# Patient Record
Sex: Male | Born: 1950 | Race: White | Hispanic: No | Marital: Married | Smoking: Former smoker
Health system: Southern US, Community
[De-identification: ages and names within clinical notes are randomized; demographics above are authoritative.]

## PROBLEM LIST (undated history)

## (undated) DIAGNOSIS — I219 Acute myocardial infarction, unspecified: Secondary | ICD-10-CM

## (undated) DIAGNOSIS — E785 Hyperlipidemia, unspecified: Secondary | ICD-10-CM

## (undated) DIAGNOSIS — E119 Type 2 diabetes mellitus without complications: Secondary | ICD-10-CM

## (undated) DIAGNOSIS — I739 Peripheral vascular disease, unspecified: Secondary | ICD-10-CM

## (undated) DIAGNOSIS — I255 Ischemic cardiomyopathy: Secondary | ICD-10-CM

## (undated) DIAGNOSIS — I10 Essential (primary) hypertension: Secondary | ICD-10-CM

## (undated) DIAGNOSIS — I509 Heart failure, unspecified: Secondary | ICD-10-CM

## (undated) DIAGNOSIS — I2510 Atherosclerotic heart disease of native coronary artery without angina pectoris: Secondary | ICD-10-CM

## (undated) HISTORY — PX: OTHER SURGICAL HISTORY: SHX169

---

## 2016-02-05 DIAGNOSIS — E119 Type 2 diabetes mellitus without complications: Secondary | ICD-10-CM

## 2017-02-17 DIAGNOSIS — I2510 Atherosclerotic heart disease of native coronary artery without angina pectoris: Secondary | ICD-10-CM | POA: Diagnosis present

## 2020-08-03 DIAGNOSIS — I719 Aortic aneurysm of unspecified site, without rupture: Secondary | ICD-10-CM | POA: Insufficient documentation

## 2021-12-22 ENCOUNTER — Other Ambulatory Visit: Payer: Self-pay

## 2021-12-22 ENCOUNTER — Encounter (HOSPITAL_COMMUNITY): Payer: Self-pay

## 2021-12-22 ENCOUNTER — Inpatient Hospital Stay (HOSPITAL_COMMUNITY)
Admission: EM | Admit: 2021-12-22 | Discharge: 2022-01-01 | DRG: 374 | Disposition: A | Discharge status: Home Health | Payer: Medicare Other | Source: Ambulatory Visit | Attending: Student | Admitting: Student

## 2021-12-22 DIAGNOSIS — E440 Moderate protein-calorie malnutrition: Secondary | ICD-10-CM | POA: Diagnosis present

## 2021-12-22 DIAGNOSIS — I248 Other forms of acute ischemic heart disease: Secondary | ICD-10-CM | POA: Diagnosis present

## 2021-12-22 DIAGNOSIS — J9601 Acute respiratory failure with hypoxia: Secondary | ICD-10-CM | POA: Diagnosis not present

## 2021-12-22 DIAGNOSIS — R0902 Hypoxemia: Secondary | ICD-10-CM

## 2021-12-22 DIAGNOSIS — E1151 Type 2 diabetes mellitus with diabetic peripheral angiopathy without gangrene: Secondary | ICD-10-CM | POA: Diagnosis present

## 2021-12-22 DIAGNOSIS — D649 Anemia, unspecified: Secondary | ICD-10-CM | POA: Diagnosis present

## 2021-12-22 DIAGNOSIS — Z87891 Personal history of nicotine dependence: Secondary | ICD-10-CM

## 2021-12-22 DIAGNOSIS — K621 Rectal polyp: Secondary | ICD-10-CM | POA: Diagnosis present

## 2021-12-22 DIAGNOSIS — I5023 Acute on chronic systolic (congestive) heart failure: Secondary | ICD-10-CM | POA: Diagnosis present

## 2021-12-22 DIAGNOSIS — C159 Malignant neoplasm of esophagus, unspecified: Secondary | ICD-10-CM

## 2021-12-22 DIAGNOSIS — R0602 Shortness of breath: Secondary | ICD-10-CM

## 2021-12-22 DIAGNOSIS — K5730 Diverticulosis of large intestine without perforation or abscess without bleeding: Secondary | ICD-10-CM | POA: Diagnosis present

## 2021-12-22 DIAGNOSIS — I2510 Atherosclerotic heart disease of native coronary artery without angina pectoris: Secondary | ICD-10-CM | POA: Diagnosis present

## 2021-12-22 DIAGNOSIS — M25561 Pain in right knee: Secondary | ICD-10-CM | POA: Diagnosis present

## 2021-12-22 DIAGNOSIS — L89312 Pressure ulcer of right buttock, stage 2: Secondary | ICD-10-CM | POA: Diagnosis present

## 2021-12-22 DIAGNOSIS — R0682 Tachypnea, not elsewhere classified: Secondary | ICD-10-CM

## 2021-12-22 DIAGNOSIS — J90 Pleural effusion, not elsewhere classified: Secondary | ICD-10-CM | POA: Diagnosis present

## 2021-12-22 DIAGNOSIS — K635 Polyp of colon: Secondary | ICD-10-CM | POA: Diagnosis present

## 2021-12-22 DIAGNOSIS — C160 Malignant neoplasm of cardia: Secondary | ICD-10-CM | POA: Diagnosis not present

## 2021-12-22 DIAGNOSIS — Z20822 Contact with and (suspected) exposure to covid-19: Secondary | ICD-10-CM | POA: Diagnosis present

## 2021-12-22 DIAGNOSIS — I130 Hypertensive heart and chronic kidney disease with heart failure and stage 1 through stage 4 chronic kidney disease, or unspecified chronic kidney disease: Secondary | ICD-10-CM | POA: Diagnosis present

## 2021-12-22 DIAGNOSIS — I5043 Acute on chronic combined systolic (congestive) and diastolic (congestive) heart failure: Secondary | ICD-10-CM | POA: Diagnosis present

## 2021-12-22 DIAGNOSIS — E782 Mixed hyperlipidemia: Secondary | ICD-10-CM | POA: Diagnosis present

## 2021-12-22 DIAGNOSIS — I509 Heart failure, unspecified: Secondary | ICD-10-CM

## 2021-12-22 DIAGNOSIS — I252 Old myocardial infarction: Secondary | ICD-10-CM

## 2021-12-22 DIAGNOSIS — Z955 Presence of coronary angioplasty implant and graft: Secondary | ICD-10-CM

## 2021-12-22 DIAGNOSIS — R778 Other specified abnormalities of plasma proteins: Secondary | ICD-10-CM | POA: Diagnosis not present

## 2021-12-22 DIAGNOSIS — I10 Essential (primary) hypertension: Secondary | ICD-10-CM | POA: Diagnosis present

## 2021-12-22 DIAGNOSIS — Z79899 Other long term (current) drug therapy: Secondary | ICD-10-CM

## 2021-12-22 DIAGNOSIS — E119 Type 2 diabetes mellitus without complications: Secondary | ICD-10-CM

## 2021-12-22 DIAGNOSIS — E876 Hypokalemia: Secondary | ICD-10-CM | POA: Diagnosis present

## 2021-12-22 DIAGNOSIS — Z800 Family history of malignant neoplasm of digestive organs: Secondary | ICD-10-CM

## 2021-12-22 DIAGNOSIS — K2970 Gastritis, unspecified, without bleeding: Secondary | ICD-10-CM

## 2021-12-22 DIAGNOSIS — R54 Age-related physical debility: Secondary | ICD-10-CM | POA: Diagnosis present

## 2021-12-22 DIAGNOSIS — K648 Other hemorrhoids: Secondary | ICD-10-CM | POA: Diagnosis present

## 2021-12-22 DIAGNOSIS — D62 Acute posthemorrhagic anemia: Secondary | ICD-10-CM | POA: Diagnosis present

## 2021-12-22 DIAGNOSIS — E1122 Type 2 diabetes mellitus with diabetic chronic kidney disease: Secondary | ICD-10-CM | POA: Diagnosis present

## 2021-12-22 DIAGNOSIS — K922 Gastrointestinal hemorrhage, unspecified: Secondary | ICD-10-CM | POA: Diagnosis not present

## 2021-12-22 DIAGNOSIS — L8990 Pressure ulcer of unspecified site, unspecified stage: Secondary | ICD-10-CM | POA: Insufficient documentation

## 2021-12-22 DIAGNOSIS — Z8249 Family history of ischemic heart disease and other diseases of the circulatory system: Secondary | ICD-10-CM

## 2021-12-22 DIAGNOSIS — I719 Aortic aneurysm of unspecified site, without rupture: Secondary | ICD-10-CM

## 2021-12-22 DIAGNOSIS — Z6821 Body mass index (BMI) 21.0-21.9, adult: Secondary | ICD-10-CM

## 2021-12-22 DIAGNOSIS — M1711 Unilateral primary osteoarthritis, right knee: Secondary | ICD-10-CM | POA: Diagnosis present

## 2021-12-22 DIAGNOSIS — I255 Ischemic cardiomyopathy: Secondary | ICD-10-CM | POA: Diagnosis present

## 2021-12-22 DIAGNOSIS — K644 Residual hemorrhoidal skin tags: Secondary | ICD-10-CM | POA: Diagnosis present

## 2021-12-22 DIAGNOSIS — N1831 Chronic kidney disease, stage 3a: Secondary | ICD-10-CM | POA: Diagnosis present

## 2021-12-22 DIAGNOSIS — K5520 Angiodysplasia of colon without hemorrhage: Secondary | ICD-10-CM | POA: Diagnosis present

## 2021-12-22 DIAGNOSIS — I739 Peripheral vascular disease, unspecified: Secondary | ICD-10-CM | POA: Diagnosis present

## 2021-12-22 DIAGNOSIS — I7143 Infrarenal abdominal aortic aneurysm, without rupture: Secondary | ICD-10-CM | POA: Diagnosis present

## 2021-12-22 HISTORY — DX: Atherosclerotic heart disease of native coronary artery without angina pectoris: I25.10

## 2021-12-22 HISTORY — DX: Heart failure, unspecified: I50.9

## 2021-12-22 HISTORY — DX: Type 2 diabetes mellitus without complications: E11.9

## 2021-12-22 HISTORY — DX: Acute myocardial infarction, unspecified: I21.9

## 2021-12-22 HISTORY — DX: Essential (primary) hypertension: I10

## 2021-12-22 HISTORY — DX: Peripheral vascular disease, unspecified: I73.9

## 2021-12-22 HISTORY — DX: Ischemic cardiomyopathy: I25.5

## 2021-12-22 HISTORY — DX: Hyperlipidemia, unspecified: E78.5

## 2021-12-22 LAB — BASIC METABOLIC PANEL
Anion gap: 14 (ref 5–15)
BUN: 29 mg/dL — ABNORMAL HIGH (ref 8–23)
CO2: 23 mmol/L (ref 22–32)
Calcium: 8.9 mg/dL (ref 8.9–10.3)
Chloride: 106 mmol/L (ref 98–111)
Creatinine, Ser: 1.4 mg/dL — ABNORMAL HIGH (ref 0.61–1.24)
GFR, Estimated: 54 mL/min — ABNORMAL LOW (ref >60)
Glucose, Bld: 160 mg/dL — ABNORMAL HIGH (ref 70–99)
Potassium: 3.3 mmol/L — ABNORMAL LOW (ref 3.5–5.1)
Sodium: 143 mmol/L (ref 135–145)

## 2021-12-22 LAB — CBC
HCT: 23 % — ABNORMAL LOW (ref 39.0–52.0)
Hemoglobin: 7.1 g/dL — ABNORMAL LOW (ref 13.0–17.0)
MCH: 28.6 pg (ref 26.0–34.0)
MCHC: 30.9 g/dL (ref 30.0–36.0)
MCV: 92.7 fL (ref 80.0–100.0)
Platelets: 275 10*3/uL (ref 150–400)
RBC: 2.48 MIL/uL — ABNORMAL LOW (ref 4.22–5.81)
RDW: 15.9 % — ABNORMAL HIGH (ref 11.5–15.5)
WBC: 11.4 10*3/uL — ABNORMAL HIGH (ref 4.0–10.5)
nRBC: 0 % (ref 0.0–0.2)

## 2021-12-22 LAB — ABO/RH: ABO/RH(D): O POS

## 2021-12-22 LAB — RESP PANEL BY RT-PCR (FLU A&B, COVID) ARPGX2
Influenza A by PCR: NEGATIVE
Influenza B by PCR: NEGATIVE
SARS Coronavirus 2 by RT PCR: NEGATIVE

## 2021-12-22 LAB — RETICULOCYTES
Immature Retic Fract: 28.8 % — ABNORMAL HIGH (ref 2.3–15.9)
RBC.: 2.51 MIL/uL — ABNORMAL LOW (ref 4.22–5.81)
Retic Count, Absolute: 95.6 10*3/uL (ref 19.0–186.0)
Retic Ct Pct: 3.8 % — ABNORMAL HIGH (ref 0.4–3.1)

## 2021-12-22 LAB — PREPARE RBC (CROSSMATCH)

## 2021-12-22 LAB — POC OCCULT BLOOD, ED: Fecal Occult Bld: POSITIVE — AB

## 2021-12-22 MED ORDER — SODIUM CHLORIDE 0.9% FLUSH
3.0000 mL | Freq: Two times a day (BID) | INTRAVENOUS | Status: DC
  Administered 2021-12-23 – 2022-01-01 (×17): 3 mL via INTRAVENOUS

## 2021-12-22 MED ORDER — SODIUM CHLORIDE 0.9 % IV SOLN: 10.0000 mL/h | Freq: Once | INTRAVENOUS | Status: DC

## 2021-12-22 MED ORDER — POTASSIUM CHLORIDE CRYS ER 20 MEQ PO TBCR
40.0000 meq | EXTENDED_RELEASE_TABLET | Freq: Once | ORAL | Status: AC
  Administered 2021-12-23: 40 meq via ORAL
  Filled 2021-12-22: qty 2

## 2021-12-22 MED ORDER — EZETIMIBE 10 MG PO TABS
10.0000 mg | ORAL_TABLET | Freq: Every day | ORAL | Status: DC
  Administered 2021-12-23 – 2022-01-01 (×10): 10 mg via ORAL
  Filled 2021-12-22 (×10): qty 1

## 2021-12-22 MED ORDER — INSULIN ASPART 100 UNIT/ML IJ SOLN
0.0000 [IU] | Freq: Three times a day (TID) | INTRAMUSCULAR | Status: DC
  Administered 2021-12-23: 3 [IU] via SUBCUTANEOUS
  Administered 2021-12-24 – 2021-12-25 (×5): 1 [IU] via SUBCUTANEOUS
  Administered 2021-12-26 – 2021-12-30 (×3): 2 [IU] via SUBCUTANEOUS
  Administered 2021-12-31: 1 [IU] via SUBCUTANEOUS
  Administered 2021-12-31 – 2022-01-01 (×3): 2 [IU] via SUBCUTANEOUS
  Filled 2021-12-22: qty 0.09

## 2021-12-22 MED ORDER — PANTOPRAZOLE INFUSION (NEW) - SIMPLE MED
8.0000 mg/h | INTRAVENOUS | Status: AC
  Administered 2021-12-22 – 2021-12-25 (×7): 8 mg/h via INTRAVENOUS
  Filled 2021-12-22: qty 100
  Filled 2021-12-22 (×2): qty 80
  Filled 2021-12-22: qty 100
  Filled 2021-12-22: qty 80
  Filled 2021-12-22 (×3): qty 100
  Filled 2021-12-22: qty 80
  Filled 2021-12-22: qty 100
  Filled 2021-12-22: qty 80
  Filled 2021-12-22: qty 100

## 2021-12-22 MED ORDER — ACETAMINOPHEN 325 MG PO TABS: 650.0000 mg | ORAL_TABLET | Freq: Four times a day (QID) | ORAL | Status: DC | PRN

## 2021-12-22 MED ORDER — ATORVASTATIN CALCIUM 80 MG PO TABS
80.0000 mg | ORAL_TABLET | Freq: Every day | ORAL | Status: DC
  Administered 2021-12-23 – 2022-01-01 (×10): 80 mg via ORAL
  Filled 2021-12-22 (×4): qty 1
  Filled 2021-12-22: qty 2
  Filled 2021-12-22 (×5): qty 1

## 2021-12-22 MED ORDER — PANTOPRAZOLE 80MG IVPB - SIMPLE MED
80.0000 mg | Freq: Once | INTRAVENOUS | Status: AC
  Administered 2021-12-22: 80 mg via INTRAVENOUS
  Filled 2021-12-22: qty 80

## 2021-12-22 MED ORDER — ACETAMINOPHEN 650 MG RE SUPP: 650.0000 mg | Freq: Four times a day (QID) | RECTAL | Status: DC | PRN

## 2021-12-22 NOTE — H&P (Signed)
History and Physical   Bob Johnson XTK:[id number] DOB: 03/13/1951 DOA: 12/22/2021  PCP: Bob Connors, FNP   Patient coming from: Home  Chief Complaint: Abnormal labs  HPI: Bob Johnson is a 71 y.o. male with medical history significant of hypertension, hyperlipidemia, PAD, CAD status post stenting, diabetes, aortic ulcer, presenting from PCP with abnormal labs.  Patient saw PCP earlier today and was notified of abnormal lab values and recommendation to proceed to the ED.  Chart review shows outpatient hemoglobin was 7.2 which is unusual for him.  Patient denies any dark or grossly bloody stools.    He reports some shortness of breath for the past couple of weeks.  He denies fevers, chills, chest pain, abdominal pain, constipation, diarrhea, nausea, vomiting.  ED Course: Vital signs in the ED significant for blood pressure in the 99M to 426 systolic.  Has had prior systolics in the 834H at baseline to the 130s.  Lab work-up in the ED showed mild leukocytosis to 11.4 and hemoglobin of 7.1.  Review shows previous hemoglobin earlier today was at 7.2.  Patient was FOBT positive in ED.  BMP still pending however outpatient labs showed creatinine stable at 1.4 and mild hypokalemia at 3.3.  Patient received IV PPI and started on PPI drip.  EDP states that they are paging GI for consult.  Review of Systems: As per HPI otherwise all other systems reviewed and are negative.  Past Medical History:  Diagnosis Date   Hyperlipemia    Hypertension    MI (myocardial infarction) ([place])     History reviewed. No pertinent surgical history.  Social History  reports that he has never smoked. He has never used smokeless tobacco. He reports that he does not currently use alcohol. He reports that he does not use drugs.  Not on File  Family History  Problem Relation Age of Onset   Cancer Mother    Alcohol abuse Father   Reviewed on admission  Prior to Admission medications   Not on File  Per  chart review: Spironolactone 25 mg daily Aspirin 81 mg daily Atorvastatin 80 mg daily Entresto 24-26 mg twice daily Zetia 10 mg daily Lasix 80 mg daily Metformin daily Metoprolol 75 mg daily Nitroglycerin as needed Potassium daily  Physical Exam: Vitals:   12/22/21 1924 12/22/21 1930 12/22/21 2030 12/22/21 2045  BP: (!) 106/56 (!) 106/56 (!) 95/49 (!) 96/51  Pulse: 91 90 83 86  Resp: 19  19 16   Temp: 98.1 F (36.7 C)     TempSrc: Axillary     SpO2: 96% 94% 94% 99%  Weight: 76.2 kg     Height: 6\' 1"  (1.854 m)       Physical Exam Constitutional:      General: He is not in acute distress.    Appearance: Normal appearance.  HENT:     Head: Normocephalic and atraumatic.     Mouth/Throat:     Mouth: Mucous membranes are moist.     Pharynx: Oropharynx is clear.  Eyes:     Extraocular Movements: Extraocular movements intact.     Pupils: Pupils are equal, round, and reactive to light.  Cardiovascular:     Rate and Rhythm: Normal rate and regular rhythm.     Pulses: Normal pulses.     Heart sounds: Normal heart sounds.  Pulmonary:     Effort: Pulmonary effort is normal. No respiratory distress.     Breath sounds: Normal breath sounds.  Abdominal:     General:  Bowel sounds are normal. There is no distension.     Palpations: Abdomen is soft.     Tenderness: There is no abdominal tenderness.  Musculoskeletal:        General: No swelling or deformity.  Skin:    General: Skin is warm and dry.  Neurological:     General: No focal deficit present.     Mental Status: Mental status is at baseline.   Labs on Admission: I have personally reviewed following labs and imaging studies  CBC: Recent Labs  Lab 12/22/21 1941  WBC 11.4*  HGB 7.1*  HCT 23.0*  MCV 92.7  PLT 423    Basic Metabolic Panel: No results for input(s): NA, K, CL, CO2, GLUCOSE, BUN, CREATININE, CALCIUM, MG, PHOS in the last 168 hours.  GFR: CrCl cannot be calculated (No successful lab value  found.).  Liver Function Tests: No results for input(s): AST, ALT, ALKPHOS, BILITOT, PROT, ALBUMIN in the last 168 hours.  Urine analysis: No results found for: COLORURINE, APPEARANCEUR, LABSPEC, PHURINE, GLUCOSEU, HGBUR, BILIRUBINUR, KETONESUR, PROTEINUR, UROBILINOGEN, NITRITE, LEUKOCYTESUR  Radiological Exams on Admission: No results found.  EKG: Not performed in the ED.  Assessment/Plan Principal Problem:   GI bleed Active Problems:   CAD (coronary artery disease)   Essential hypertension   Mixed hyperlipidemia   PAD (peripheral artery disease) (HCC)   Type 2 diabetes mellitus without complication, without long-term current use of insulin (HCC)   GI bleeding > Patient sent to the ED due to abnormal labs at PCP office.  Their hemoglobin found to be 7.2.  Here hemoglobin remains stable at 7.1. > No grossly bloody or dark stools.  Appeared brown on rectal exam per ED PA but was heme positive. > Started on PPI in ED and 2 units PRBC ordered by EDP > EDP has also consulted GI and is waiting to hear back. - Due to borderline blood pressures (which could be his baseline) will monitor on progressive for now - Continue with PPI drip - Appreciate GI recommendations - Sips with meds overnight - Continue with blood transfusion - Trend hemoglobin  CAD (with history of MI and stenting) PAD Hypertension CHF (last echo in 2020 with mildly reduced LVEF and normal RVEF) - Holding home aspirin in the setting of GI bleed as above - Holding home Entresto, spironolactone, Lasix and metoprolol in the setting of borderline blood pressure with active GI bleed as above - Continue atorvastatin and Zetia  Diabetes - SSI  Hypokalemia > Labs here are pending however outpatient labs showed stable hypokalemia at 3.3 and normal magnesium. - 40 mill equivalents p.o. potassium - Trend renal function and electrolytes  DVT prophylaxis: SCDs Code Status:   Full Family Communication:  Wife and  daughter updated at bedside Disposition Plan:   Patient is from:  Home  Anticipated DC to:  Home  Anticipated DC date:  1 to 3 days  Anticipated DC barriers: None  Consults called:  GI, consulted by EDP, still waiting to hear back at the time that we spoke. Admission status:  Alteration, progressive  Severity of Illness: The appropriate patient status for this patient is OBSERVATION. Observation status is judged to be reasonable and necessary in order to provide the required intensity of service to ensure the patient's safety. The patient's presenting symptoms, physical exam findings, and initial radiographic and laboratory data in the context of their medical condition is felt to place them at decreased risk for further clinical deterioration. Furthermore, it is anticipated that  the patient will be medically stable for discharge from the hospital within 2 midnights of admission.    Marcelyn Bruins MD [facility]  How to contact the [facility] Attending or Consulting provider [place] or covering provider during after hours [place], for this patient?   Check the care team in [facility] and look for a) attending/consulting [facility] provider listed and b) the [facility] team listed Log into [email or web address] and use [place]'s universal password to access. If you do not have the password, please contact the hospital operator. Locate the [facility] provider you are looking for under [facility] and page to a number that you can be directly reached. If you still have difficulty reaching the provider, please page the [facility] (Director on Call) for the Hospitalists listed on amion for assistance.  12/22/2021, 9:56 PM

## 2021-12-22 NOTE — ED Triage Notes (Signed)
Patient sent to ED by PCP for low HGB. Pt states he has been feeling SOB, pt noted to be pale upon triage.

## 2021-12-23 ENCOUNTER — Encounter (HOSPITAL_COMMUNITY): Payer: Self-pay | Admitting: Internal Medicine

## 2021-12-23 ENCOUNTER — Observation Stay (HOSPITAL_COMMUNITY): Payer: Medicare Other

## 2021-12-23 DIAGNOSIS — J9601 Acute respiratory failure with hypoxia: Secondary | ICD-10-CM | POA: Diagnosis not present

## 2021-12-23 DIAGNOSIS — E440 Moderate protein-calorie malnutrition: Secondary | ICD-10-CM | POA: Diagnosis not present

## 2021-12-23 DIAGNOSIS — I714 Abdominal aortic aneurysm, without rupture, unspecified: Secondary | ICD-10-CM | POA: Diagnosis not present

## 2021-12-23 DIAGNOSIS — D649 Anemia, unspecified: Secondary | ICD-10-CM | POA: Diagnosis present

## 2021-12-23 DIAGNOSIS — I719 Aortic aneurysm of unspecified site, without rupture: Secondary | ICD-10-CM

## 2021-12-23 DIAGNOSIS — N1831 Chronic kidney disease, stage 3a: Secondary | ICD-10-CM | POA: Diagnosis not present

## 2021-12-23 DIAGNOSIS — I7143 Infrarenal abdominal aortic aneurysm, without rupture: Secondary | ICD-10-CM | POA: Diagnosis present

## 2021-12-23 DIAGNOSIS — I2510 Atherosclerotic heart disease of native coronary artery without angina pectoris: Secondary | ICD-10-CM | POA: Diagnosis not present

## 2021-12-23 DIAGNOSIS — K5520 Angiodysplasia of colon without hemorrhage: Secondary | ICD-10-CM | POA: Diagnosis not present

## 2021-12-23 DIAGNOSIS — I7409 Other arterial embolism and thrombosis of abdominal aorta: Secondary | ICD-10-CM | POA: Diagnosis not present

## 2021-12-23 DIAGNOSIS — E876 Hypokalemia: Secondary | ICD-10-CM | POA: Diagnosis not present

## 2021-12-23 DIAGNOSIS — K621 Rectal polyp: Secondary | ICD-10-CM | POA: Diagnosis not present

## 2021-12-23 DIAGNOSIS — C160 Malignant neoplasm of cardia: Secondary | ICD-10-CM | POA: Diagnosis not present

## 2021-12-23 DIAGNOSIS — E1151 Type 2 diabetes mellitus with diabetic peripheral angiopathy without gangrene: Secondary | ICD-10-CM | POA: Diagnosis not present

## 2021-12-23 DIAGNOSIS — M1711 Unilateral primary osteoarthritis, right knee: Secondary | ICD-10-CM | POA: Diagnosis not present

## 2021-12-23 DIAGNOSIS — K644 Residual hemorrhoidal skin tags: Secondary | ICD-10-CM | POA: Diagnosis not present

## 2021-12-23 DIAGNOSIS — I255 Ischemic cardiomyopathy: Secondary | ICD-10-CM | POA: Diagnosis not present

## 2021-12-23 DIAGNOSIS — I130 Hypertensive heart and chronic kidney disease with heart failure and stage 1 through stage 4 chronic kidney disease, or unspecified chronic kidney disease: Secondary | ICD-10-CM | POA: Diagnosis not present

## 2021-12-23 DIAGNOSIS — I5043 Acute on chronic combined systolic (congestive) and diastolic (congestive) heart failure: Secondary | ICD-10-CM | POA: Diagnosis not present

## 2021-12-23 DIAGNOSIS — Z20822 Contact with and (suspected) exposure to covid-19: Secondary | ICD-10-CM | POA: Diagnosis not present

## 2021-12-23 DIAGNOSIS — L89312 Pressure ulcer of right buttock, stage 2: Secondary | ICD-10-CM | POA: Diagnosis not present

## 2021-12-23 DIAGNOSIS — K648 Other hemorrhoids: Secondary | ICD-10-CM | POA: Diagnosis not present

## 2021-12-23 DIAGNOSIS — I252 Old myocardial infarction: Secondary | ICD-10-CM | POA: Diagnosis not present

## 2021-12-23 DIAGNOSIS — E782 Mixed hyperlipidemia: Secondary | ICD-10-CM | POA: Diagnosis not present

## 2021-12-23 DIAGNOSIS — E1122 Type 2 diabetes mellitus with diabetic chronic kidney disease: Secondary | ICD-10-CM | POA: Diagnosis not present

## 2021-12-23 DIAGNOSIS — K922 Gastrointestinal hemorrhage, unspecified: Secondary | ICD-10-CM | POA: Diagnosis not present

## 2021-12-23 DIAGNOSIS — D62 Acute posthemorrhagic anemia: Secondary | ICD-10-CM | POA: Diagnosis not present

## 2021-12-23 DIAGNOSIS — K635 Polyp of colon: Secondary | ICD-10-CM | POA: Diagnosis not present

## 2021-12-23 DIAGNOSIS — I5023 Acute on chronic systolic (congestive) heart failure: Secondary | ICD-10-CM | POA: Diagnosis present

## 2021-12-23 DIAGNOSIS — I248 Other forms of acute ischemic heart disease: Secondary | ICD-10-CM | POA: Diagnosis not present

## 2021-12-23 LAB — COMPREHENSIVE METABOLIC PANEL
ALT: 9 U/L (ref 0–44)
AST: 15 U/L (ref 15–41)
Albumin: 3 g/dL — ABNORMAL LOW (ref 3.5–5.0)
Alkaline Phosphatase: 58 U/L (ref 38–126)
Anion gap: 12 (ref 5–15)
BUN: 26 mg/dL — ABNORMAL HIGH (ref 8–23)
CO2: 23 mmol/L (ref 22–32)
Calcium: 8.6 mg/dL — ABNORMAL LOW (ref 8.9–10.3)
Chloride: 107 mmol/L (ref 98–111)
Creatinine, Ser: 1.24 mg/dL (ref 0.61–1.24)
GFR, Estimated: >60 mL/min (ref >60)
Glucose, Bld: 107 mg/dL — ABNORMAL HIGH (ref 70–99)
Potassium: 3.4 mmol/L — ABNORMAL LOW (ref 3.5–5.1)
Sodium: 142 mmol/L (ref 135–145)
Total Bilirubin: 0.4 mg/dL (ref 0.3–1.2)
Total Protein: 6.7 g/dL (ref 6.5–8.1)

## 2021-12-23 LAB — CBC
HCT: 26.5 % — ABNORMAL LOW (ref 39.0–52.0)
Hemoglobin: 8.4 g/dL — ABNORMAL LOW (ref 13.0–17.0)
MCH: 28.5 pg (ref 26.0–34.0)
MCHC: 31.7 g/dL (ref 30.0–36.0)
MCV: 89.8 fL (ref 80.0–100.0)
Platelets: 206 10*3/uL (ref 150–400)
RBC: 2.95 MIL/uL — ABNORMAL LOW (ref 4.22–5.81)
RDW: 16.4 % — ABNORMAL HIGH (ref 11.5–15.5)
WBC: 10.1 10*3/uL (ref 4.0–10.5)
nRBC: 0 % (ref 0.0–0.2)

## 2021-12-23 LAB — IRON AND TIBC
Iron: 35 ug/dL — ABNORMAL LOW (ref 45–182)
Saturation Ratios: 10 % — ABNORMAL LOW (ref 17.9–39.5)
TIBC: 357 ug/dL (ref 250–450)
UIBC: 322 ug/dL

## 2021-12-23 LAB — TYPE AND SCREEN
ABO/RH(D): O POS
Antibody Screen: NEGATIVE

## 2021-12-23 LAB — SEDIMENTATION RATE: Sed Rate: 65 mm/hr — ABNORMAL HIGH (ref 0–16)

## 2021-12-23 LAB — CBG MONITORING, ED
Glucose-Capillary: 101 mg/dL — ABNORMAL HIGH (ref 70–99)
Glucose-Capillary: 114 mg/dL — ABNORMAL HIGH (ref 70–99)
Glucose-Capillary: 119 mg/dL — ABNORMAL HIGH (ref 70–99)

## 2021-12-23 LAB — FERRITIN: Ferritin: 37 ng/mL (ref 24–336)

## 2021-12-23 LAB — GLUCOSE, CAPILLARY: Glucose-Capillary: 217 mg/dL — ABNORMAL HIGH (ref 70–99)

## 2021-12-23 LAB — C-REACTIVE PROTEIN: CRP: 5.7 mg/dL — ABNORMAL HIGH (ref <1.0)

## 2021-12-23 LAB — HIV ANTIBODY (ROUTINE TESTING W REFLEX): HIV Screen 4th Generation wRfx: NONREACTIVE

## 2021-12-23 MED ORDER — FUROSEMIDE 10 MG/ML IJ SOLN: 40.0000 mg | Freq: Two times a day (BID) | INTRAMUSCULAR | Status: DC

## 2021-12-23 MED ORDER — FUROSEMIDE 10 MG/ML IJ SOLN
20.0000 mg | Freq: Two times a day (BID) | INTRAMUSCULAR | Status: DC
  Administered 2021-12-23 – 2021-12-24 (×2): 20 mg via INTRAVENOUS
  Filled 2021-12-23 (×3): qty 2

## 2021-12-23 MED ORDER — SODIUM CHLORIDE (PF) 0.9 % IJ SOLN
INTRAMUSCULAR | Status: AC
  Filled 2021-12-23: qty 50

## 2021-12-23 MED ORDER — IPRATROPIUM-ALBUTEROL 0.5-2.5 (3) MG/3ML IN SOLN: 3.0000 mL | Freq: Four times a day (QID) | RESPIRATORY_TRACT | Status: DC | PRN

## 2021-12-23 MED ORDER — IOHEXOL 350 MG/ML SOLN
100.0000 mL | Freq: Once | INTRAVENOUS | Status: AC | PRN
  Administered 2021-12-23: 100 mL via INTRAVENOUS

## 2021-12-23 MED ORDER — POTASSIUM CHLORIDE 10 MEQ/100ML IV SOLN
10.0000 meq | INTRAVENOUS | Status: AC
  Administered 2021-12-23: 10 meq via INTRAVENOUS
  Filled 2021-12-23 (×2): qty 100

## 2021-12-23 MED ORDER — PEG 3350-KCL-NA BICARB-NACL 420 G PO SOLR
4000.0000 mL | Freq: Once | ORAL | Status: DC
Start: 2021-12-23 — End: 2021-12-23
  Filled 2021-12-23: qty 4000

## 2021-12-23 MED ORDER — FUROSEMIDE 10 MG/ML IJ SOLN
40.0000 mg | Freq: Once | INTRAMUSCULAR | Status: AC
  Administered 2021-12-23: 40 mg via INTRAVENOUS

## 2021-12-23 MED ORDER — GUAIFENESIN ER 600 MG PO TB12
600.0000 mg | ORAL_TABLET | Freq: Two times a day (BID) | ORAL | Status: DC
  Administered 2021-12-23 – 2022-01-01 (×19): 600 mg via ORAL
  Filled 2021-12-23 (×19): qty 1

## 2021-12-23 MED ORDER — POTASSIUM CHLORIDE CRYS ER 20 MEQ PO TBCR
40.0000 meq | EXTENDED_RELEASE_TABLET | Freq: Once | ORAL | Status: AC
  Administered 2021-12-23: 40 meq via ORAL
  Filled 2021-12-23: qty 2

## 2021-12-23 MED ORDER — FUROSEMIDE 10 MG/ML IJ SOLN
20.0000 mg | Freq: Once | INTRAMUSCULAR | Status: AC
  Administered 2021-12-23: 20 mg via INTRAVENOUS
  Filled 2021-12-23: qty 4

## 2021-12-23 NOTE — Progress Notes (Signed)
Rapid response note  Patient admitted from [facility] for vascular evaluation given inferior aortic aneurysm.  Initially presenting per PCP request for low hemoglobin concerning for symptomatic anemia as patient had been having some dyspnea with exertion.  Patient received blood at previous facility, routine imaging shows "impending rupture of inferior aortic aneurysm" at which time vascular surgery was consulted and patient was transferred to [facility] for further evaluation and treatment.  At intake patient was noted to be hypoxic tachycardic and tachypneic with marked bilateral rales likely in fulminant volume overload/heart failure exacerbation status post blood transfusion -as such rapid response was called, Dr. Carlis Abbott of vascular surgery and myself responded.  Patient diuresing well, will place on bipap PRN over the next few hours as he continues to diurese.  Currently tolerating nonrebreather well, EKG unremarkable, chest x-ray pending.

## 2021-12-23 NOTE — Assessment & Plan Note (Addendum)
-  Continue with Lipitor and Zetia.

## 2021-12-23 NOTE — Progress Notes (Signed)
Initial Nutrition Assessment  DOCUMENTATION CODES:   Not applicable  INTERVENTION:   When diet is advanced, add:  Ensure Enlive po BID, each supplement provides 350 kcal and 20 grams of protein.  MVI with minerals daily  NUTRITION DIAGNOSIS:   Inadequate oral intake related to inability to eat, altered GI function as evidenced by NPO status.  GOAL:   Patient will meet greater than or equal to 90% of their needs  MONITOR:   Diet advancement, PO intake, Supplement acceptance, Labs  REASON FOR ASSESSMENT:   Malnutrition Screening Tool    ASSESSMENT:   71 yo male admitted with GI bleed. PMH includes HTN, HLD, PAD, CAD, DM, aortic ulcer.  GI has been consulted. Plans for colonoscopy after cleared by vascular surgery.   Unable to visit patient or complete NFPE, multiple healthcare providers in his room providing care.   On admission nutrition screen, patient reported 15-20 lb weight loss over the past 2 months, appetite has been fair. No weight history available in EMR for review. Currently NPO.  Labs reviewed. K 3.4 CBG: 114-119  Medications reviewed and include Lasix, Novolog.    NUTRITION - FOCUSED PHYSICAL EXAM:  Unable to complete  Diet Order:   Diet Order             Diet NPO time specified  Diet effective midnight           Diet NPO time specified  Diet effective midnight                   EDUCATION NEEDS:   No education needs have been identified at this time  Skin:  Skin Assessment: Reviewed RN Assessment  Last BM:  no BM documented  Height:   Ht Readings from Last 1 Encounters:  12/22/21 6\' 1"  (1.854 m)    Weight:   Wt Readings from Last 1 Encounters:  12/22/21 76.2 kg    BMI:  Body mass index is 22.16 kg/m.  Estimated Nutritional Needs:   Kcal:  2100-2300  Protein:  100-115 gm  Fluid:  >/= 2.2 L    Lucas Mallow RD, LDN, CNSC Please refer to Amion for contact information.

## 2021-12-23 NOTE — Assessment & Plan Note (Addendum)
CT report was read as impending AAA rupture.  CT reviewed by vascular surgery.  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  He is not concerned about aortitis.  No surgical intervention indicated. -Outpatient follow-up in 6 months

## 2021-12-23 NOTE — Progress Notes (Signed)
Pt taken off bipap and placed on a 3l [place] per Dr. Avon Gully. Pt is tolerating well. RT will continue to monitor.

## 2021-12-23 NOTE — Assessment & Plan Note (Addendum)
TTE with LVEF of 20 to 25% (previously 40%), global hypokinesis, G2 DD.  Could be exacerbated by anemia and blood transfusion.  Started on IV Lasix with improvement in his respiratory symptoms and fluid status.  Transition to p.o. Lasix.  Net -4.6 L.  Clear for discharge on home p.o. Lasix (confirmed with cardiology), Toprol-XL, Entresto, Farxiga and Aldactone.  Counseled on sodium and fluid restriction and daily weight.  Outpatient follow-up with cardiology.

## 2021-12-23 NOTE — Assessment & Plan Note (Addendum)
-  Cardiac meds as above.

## 2021-12-23 NOTE — Plan of Care (Signed)

## 2021-12-23 NOTE — Assessment & Plan Note (Signed)
Patient with history of penetrating ulcer aorta.  He was suppose to have year follow up.  Continue with statins and BP management.  WiIll check CTA due to SOB and anemia

## 2021-12-23 NOTE — Assessment & Plan Note (Deleted)
Patient presents with anemia hemoglobin down to 7.2.  Prior hemoglobin 10 months ago at 13. He was found to have occult blood positive. He denies melena or bloody stool. He  is receiving 2 units of packed red blood cell. GI consulted and recommended endoscopy colonoscopy.

## 2021-12-23 NOTE — Consult Note (Signed)
Hospital Consult    Reason for Consult:  Abdominal aortic aneurysm  Referring Physician:  Dr. Tyrell Antonio MRN #:  [id number]  History of Present Illness: This is a 71 y.o. male with history of congestive heart failure, diabetes, hypertension, coronary artery disease that vascular surgery has been consulted with for abdominal aortic aneurysm.  Patient was admitted with shortness of breath and new onset anemia and seen in the [facility] ED.  CTA was obtained in the [place] long ED given a reported history of penetrating aortic ulcer.  This was read by the radiologist as infrarenal aneurysm measuring up to 3.8 cm with mural thrombus with some hyperdense thickening and periaortic stranding.  He was subsequently transferred to [facility] for vascular evaluation.  On my evaluation here after transfer from [facility], patient denies any abdominal or back pain.  He states he went to the ED due to shortness of breath over the last 3 days.  His aneurysm has been previously identified based on a CT at [facility] on 08/03/2020 that showed ectasia of the infrarenal aorta measuring 3.2 cm with laminated plaque.  Also concern for penetrating aortic ulcer of the aortic arch measuring 9 mm x 20 mm.  This report is in care everywhere.Marland Kitchen  He denies knowledge of his aneurysm.  Past Medical History:  Diagnosis Date   Hyperlipemia    Hypertension    MI (myocardial infarction) ([place])     History reviewed. No pertinent surgical history.  No Known Allergies  Prior to Admission medications   Medication Sig Start Date End Date Taking? Authorizing Provider  aspirin 81 MG chewable tablet Chew 81 mg by mouth daily.   Yes [provider]  atorvastatin (LIPITOR) 80 MG tablet Take 80 mg by mouth daily. 11/19/21  Yes [provider]  ENTRESTO 24-26 MG 1 tablet 2 (two) times daily. 12/20/21  Yes [provider]  ezetimibe (ZETIA) 10 MG tablet Take 10 mg by mouth daily. 11/19/21  Yes [provider]   furosemide (LASIX) 40 MG tablet Take 80 mg by mouth daily. 11/19/21  Yes [provider]  metFORMIN (GLUCOPHAGE-XR) 750 MG 24 hr tablet 750 mg 2 (two) times daily. 11/19/21  Yes [provider]  metoprolol succinate (TOPROL-XL) 50 MG 24 hr tablet Take 75 mg by mouth daily. 04/19/21 04/19/22 Yes [provider]  Multiple Vitamin (MULTIVITAMIN) capsule Take 1 capsule by mouth daily.   Yes [provider]  nitroGLYCERIN (NITROSTAT) 0.4 MG SL tablet Place 0.4 mg under the tongue every 5 (five) minutes as needed for chest pain. 04/19/21  Yes [provider]  potassium chloride (KLOR-CON M) 10 MEQ tablet Take 10 mEq by mouth daily. 02/23/21 02/23/22 Yes [provider]    Social History   Socioeconomic History   Marital status: Married    Spouse name: Not on file   Number of children: Not on file   Years of education: Not on file   Highest education level: Not on file  Occupational History   Not on file  Tobacco Use   Smoking status: Never   Smokeless tobacco: Never  Substance and Sexual Activity   Alcohol use: Not Currently   Drug use: Never   Sexual activity: Not Currently  Other Topics Concern   Not on file  Social History Narrative   Not on file   Social Determinants of Health   Financial Resource Strain: Not on file  Food Insecurity: Not on file  Transportation Needs: Not on file  Physical Activity: Not on file  Stress: Not on file  Social Connections: Not on file  Intimate Partner Violence: Not on file    Family History  Problem Relation Age of Onset   Cancer Mother    Alcohol abuse Father     ROS: [x]  Positive   [ ]  Negative   [ ]  All sytems reviewed and are negative  Cardiovascular: []  chest pain/pressure []  palpitations []  SOB lying flat []  DOE []  pain in legs while walking []  pain in legs at rest []  pain in legs at night []  non-healing ulcers []  hx of DVT []  swelling in legs  Pulmonary: []  productive  cough []  asthma/wheezing []  home O2  Neurologic: []  weakness in []  arms []  legs []  numbness in []  arms []  legs []  hx of CVA []  mini stroke [] difficulty speaking or slurred speech []  temporary loss of vision in one eye []  dizziness  Hematologic: []  hx of cancer []  bleeding problems []  problems with blood clotting easily  Endocrine:   []  diabetes []  thyroid disease  GI []  vomiting blood []  blood in stool  GU: []  CKD/renal failure []  HD--[]  M/W/F or []  T/T/S []  burning with urination []  blood in urine  Psychiatric: []  anxiety []  depression  Musculoskeletal: []  arthritis []  joint pain  Integumentary: []  rashes []  ulcers  Constitutional: []  fever []  chills   Physical Examination  Vitals:   12/23/21 1230 12/23/21 1315  BP:    Pulse: 86 99  Resp:    Temp:    SpO2: (!) 88% 97%   Body mass index is 22.16 kg/m.  General:  acute distress, SOB Gait: Not observed HENT: WNL, normocephalic Pulmonary: labored breathing Cardiac: regular, without  Murmurs, rubs or gallops Abdomen:  soft, NT/ND, no pain with palpation of aneurysm  Vascular Exam/Pulses: Palpable femoral pulses bilaterally. No palpable pedal pulses. Feet are warm. Extremities: without ischemic changes Musculoskeletal: no muscle wasting or atrophy  Neurologic: A&O X 3; Appropriate Affect ; SENSATION: normal; MOTOR FUNCTION:  moving all extremities equally. Speech is fluent/normal   CBC    Component Value Date/Time   WBC 10.1 12/23/2021 0530   RBC 2.95 (L) 12/23/2021 0530   HGB 8.4 (L) 12/23/2021 0530   HCT 26.5 (L) 12/23/2021 0530   PLT 206 12/23/2021 0530   MCV 89.8 12/23/2021 0530   MCH 28.5 12/23/2021 0530   MCHC 31.7 12/23/2021 0530   RDW 16.4 (H) 12/23/2021 0530    BMET    Component Value Date/Time   NA 142 12/23/2021 0530   K 3.4 (L) 12/23/2021 0530   CL 107 12/23/2021 0530   CO2 23 12/23/2021 0530   GLUCOSE 107 (H) 12/23/2021 0530   BUN 26 (H) 12/23/2021 0530    CREATININE 1.24 12/23/2021 0530   CALCIUM 8.6 (L) 12/23/2021 0530   GFRNONAA >60 12/23/2021 0530    COAGS: No results found for: INR, PROTIME   Non-Invasive Vascular Imaging:    CTA chest abdomen pelvis reviewed and he has a 3.8 cm infrarenal abdominal aortic aneurysm with mural thrombus and I do not appreciate any evidence of rupture.  ASSESSMENT/PLAN: This is a 71 y.o. male with multiple medical comorbidities that presented to [facility] ED with dyspnea and new onset anemia and on further work-up there was concern for abdominal aorta aneurysm with mural thrombus, hyperdense thickening, and possible stranding.  On exam patient has no abdominal pain or back pain.  Specifically has no pain with palpation of the aneurysm.  This has  been identified on previous CT imaging from [place] in 2021 when it measured 3.2 cm and there was comment of mural thrombus - this is also very common with aneurysm disease.  After review of imaging, I see a 3.8 cm infrarenal abdominal aortic aneurysm again with mural thrombus.  I see no evidence of rupture.  I do not plan any surgical intervention at this time.  I do not have any reason to suspect this is aortitis again with no symptoms.  Vascular will follow.  Marty Heck, MD [facility] Office: [place]

## 2021-12-23 NOTE — Assessment & Plan Note (Addendum)
K 3.5.  Received p.o. KCl 40x1 prior to discharge

## 2021-12-23 NOTE — Assessment & Plan Note (Addendum)
Elevated troponin likely demand ischemia. -Manage CHF and anemia as above.

## 2021-12-23 NOTE — Assessment & Plan Note (Addendum)
-  Continue statins and Zetia. -Patient to resume aspirin in a week.  Changed to enteric-coated

## 2021-12-23 NOTE — Consult Note (Addendum)
Referring Provider: Elmarie Shiley, MD Primary Care Physician:  Fransisca Connors, FNP Primary Gastroenterologist:  Althia Forts  Reason for Consultation:  Anemia, heme-positive stool  HPI: Bob Johnson is a 71 y.o. male with medical history significant of hypertension, hyperlipidemia, PAD, CAD status post stenting, diabetes. Patient presented to the ED 12/22/21 following abnormal labs with his PCP earlier in the day. Outpatient hemoglobin was 7.2, and upon examination in the ED, patient was found to have heme-positive stool. Patient denied grossly bloody or dark stools. He has been feeling short of breath for the last couple weeks. Systolic blood pressure in the ED in the 90s to 110s (prior readings have been 110s to 130s). He received IV PPI and has been started on PPI drip. He has also received 2 units PRBCs.  There is no record of past colonoscopy or upper endoscopy. Per chart review, heme positive stool has been an issue in the past and patient has previously declined colonoscopy.  Patient resting comfortably in bed this morning. His wife is at bedside. Patient states he is feeling fine this morning. He denies nausea, vomiting, reflux, heartburn, or abdominal pain. Bowel movements are regular, 1-2x/day of normal, formed, brown stool. Denies diarrhea, constipation, melena, or hematochezia. He has been short of breath for the last couple weeks.  Patient has declined colonoscopy in the past because he did not like the idea of it and did not want to have the procedure done. He reports family history of rectal cancer in his mother, in her 28s.  Denies other family history of GI malignancy or disease. He and his wife report that he has lost about 15 pounds in the last 2 weeks.  Patient denies MI/stroke since his MI in 2012. Denies NSAID use or alcohol use but has remote history of smoking prior to his MI. His wife states he was previously on a blood thinner but this was discontinued a while ago.  Home aspirin has been held since being in the ED.   Past Medical History:  Diagnosis Date   Hyperlipemia    Hypertension    MI (myocardial infarction) ([place])     History reviewed. No pertinent surgical history.  Prior to Admission medications   Medication Sig Start Date End Date Taking? Authorizing Provider  aspirin 81 MG chewable tablet Chew 81 mg by mouth daily.   Yes [provider]  atorvastatin (LIPITOR) 80 MG tablet Take 80 mg by mouth daily. 11/19/21  Yes [provider]  ENTRESTO 24-26 MG 1 tablet 2 (two) times daily. 12/20/21  Yes [provider]  ezetimibe (ZETIA) 10 MG tablet Take 10 mg by mouth daily. 11/19/21  Yes [provider]  furosemide (LASIX) 40 MG tablet Take 80 mg by mouth daily. 11/19/21  Yes [provider]  metFORMIN (GLUCOPHAGE-XR) 750 MG 24 hr tablet 750 mg 2 (two) times daily. 11/19/21  Yes [provider]  metoprolol succinate (TOPROL-XL) 50 MG 24 hr tablet Take 75 mg by mouth daily. 04/19/21 04/19/22 Yes [provider]  Multiple Vitamin (MULTIVITAMIN) capsule Take 1 capsule by mouth daily.   Yes [provider]  nitroGLYCERIN (NITROSTAT) 0.4 MG SL tablet Place 0.4 mg under the tongue every 5 (five) minutes as needed for chest pain. 04/19/21  Yes [provider]  potassium chloride (KLOR-CON M) 10 MEQ tablet Take 10 mEq by mouth daily. 02/23/21 02/23/22 Yes [provider]    Scheduled Meds:  atorvastatin  80 mg Oral Daily   ezetimibe  10 mg Oral Daily   insulin aspart  0-9 Units Subcutaneous TID WC   sodium chloride flush  3 mL Intravenous Q12H   Continuous Infusions:  sodium chloride     pantoprazole 8 mg/hr (12/23/21 0703)   potassium chloride     PRN Meds:.acetaminophen **OR** acetaminophen  Allergies as of 12/22/2021   (No Known Allergies)    Family History  Problem Relation Age of Onset   Cancer Mother    Alcohol abuse Father     Social History   Socioeconomic  History   Marital status: Married    Spouse name: Not on file   Number of children: Not on file   Years of education: Not on file   Highest education level: Not on file  Occupational History   Not on file  Tobacco Use   Smoking status: Never   Smokeless tobacco: Never  Substance and Sexual Activity   Alcohol use: Not Currently   Drug use: Never   Sexual activity: Not Currently  Other Topics Concern   Not on file  Social History Narrative   Not on file   Social Determinants of Health   Financial Resource Strain: Not on file  Food Insecurity: Not on file  Transportation Needs: Not on file  Physical Activity: Not on file  Stress: Not on file  Social Connections: Not on file  Intimate Partner Violence: Not on file    Review of Systems: Review of Systems  Constitutional:  Negative for chills and fever.  HENT:  Negative for hearing loss and tinnitus.   Eyes:  Negative for discharge and redness.  Respiratory:  Positive for shortness of breath. Negative for cough and hemoptysis.   Cardiovascular:  Negative for chest pain and leg swelling.  Gastrointestinal:  Negative for abdominal pain, blood in stool, heartburn, melena, nausea and vomiting.  Genitourinary:  Negative for dysuria and hematuria.  Musculoskeletal:  Negative for falls and joint pain.  Skin:  Negative for itching and rash.  Neurological:  Negative for seizures and loss of consciousness.  Endo/Heme/Allergies:  Negative for polydipsia. Does not bruise/bleed easily.  Psychiatric/Behavioral:  Negative for memory loss and substance abuse.     Physical Exam: Physical Exam Constitutional:      General: He is not in acute distress.    Appearance: Normal appearance.  HENT:     Head: Normocephalic and atraumatic.     Right Ear: External ear normal.     Left Ear: External ear normal.     Nose: Nose normal.     Mouth/Throat:     Mouth: Mucous membranes are moist.     Pharynx: Oropharynx is clear.  Eyes:     General:  No scleral icterus.    Extraocular Movements: Extraocular movements intact.     Comments: Conjunctival pallor  Cardiovascular:     Rate and Rhythm: Normal rate and regular rhythm.     Pulses: Normal pulses.     Heart sounds: Normal heart sounds.  Pulmonary:     Effort: Pulmonary effort is normal.     Breath sounds: Normal breath sounds.  Abdominal:     General: Abdomen is flat. Bowel sounds are normal. There is no distension.     Palpations: Abdomen is soft.     Tenderness: There is no abdominal tenderness. There is no guarding.  Musculoskeletal:     Cervical back: Normal range of motion and neck supple.     Right lower leg: No edema.     Left  lower leg: No edema.  Skin:    General: Skin is warm and dry.     Coloration: Skin is pale.  Neurological:     General: No focal deficit present.     Mental Status: He is alert and oriented to person, place, and time.  Psychiatric:        Mood and Affect: Mood normal.        Behavior: Behavior normal.        Thought Content: Thought content normal.        Judgment: Judgment normal.      Vital signs: Vitals:   12/23/21 0630 12/23/21 0700  BP: 104/64 (!) 110/58  Pulse: 91 89  Resp:    Temp:    SpO2: (!) 89% 90%      GI:  Lab Results: Recent Labs    12/22/21 1941 12/23/21 0530  WBC 11.4* 10.1  HGB 7.1* 8.4*  HCT 23.0* 26.5*  PLT 275 206   BMET Recent Labs    12/22/21 1941 12/23/21 0530  NA 143 142  K 3.3* 3.4*  CL 106 107  CO2 23 23  GLUCOSE 160* 107*  BUN 29* 26*  CREATININE 1.40* 1.24  CALCIUM 8.9 8.6*   LFT Recent Labs    12/23/21 0530  PROT 6.7  ALBUMIN 3.0*  AST 15  ALT 9  ALKPHOS 58  BILITOT 0.4   PT/INR No results for input(s): LABPROT, INR in the last 72 hours.   Studies/Results: No results found.  Impression: Anemia with heme-positive stool - Hgb 8.4 (after 2 units PRBC, was 7.1), Hct 26.5 - Patient's baseline Hgb around 13.0 - BUN 26, Cr 1.24 - Vitals 2/9 @ 0700: BP 110/58, pulse  89, SpO2 90  CHF - Echo 2021: EF 40%  CAD - Anterior MI in 2012 with bare metal stenting to LAD  Plan: Recommend EGD/Colon to evaluate anemia and heme positive stool, especially given family history of colon cancer in first degree relative. Discussed with patient and at this time he is agreeable to colonoscopy but refuses EGD.  Plan for colonoscopy tomorrow.  I thoroughly discussed the procedure to include nature, alternatives, benefits, and risks including but not limited to bleeding, perforation, infection, anesthesia/cardiac and pulmonary complications.  Patient provides understanding and gave verbal consent to proceed. Nulytely prep, clear liquid diet, NPO at midnight. Continue PPI drip Continue to monitor CBC and transfuse to keep hemoglobin above 7. [facility] will follow     LOS: 0 days   Angelique Holm  PA-C 12/23/2021, 8:21 AM  Contact #  [phone]

## 2021-12-23 NOTE — Progress Notes (Addendum)
Pt arrived to 4E from [facility] via [facility]. Pt presented in respiratory distress: tripod stance, O2 83-85% 5LNC, HR 130s-140s, crackles present in all lobe upon auscultation, skin pale. Pt placed on non-rebreather @15L . Dr. Carlis Abbott VVS, Dr. Avon Gully [facility], and RR paged and notified. All responded to bedside. Orders given and pt received 40mg  lasix. RT paged an pt placed on BiPap. Wife at bedside. Pt A&Ox4. Call light in reach.  Raelyn Number, RN

## 2021-12-23 NOTE — Hospital Course (Addendum)
71 year old M with PMH of CAD s/p BMS to LAD in 8280, ICM/systolic CHF, PAD, DM-2, HTN, HLD, penetrating atherosclerotic ulcer of aorta and former smoker presented to PCP on 2/8 with dyspnea and DOE for 2 weeks, decreased appetite and 19 pounds weight loss since 09/2021 and found to be anemic with Hgb of 7.2.  He was directed to ED, and admitted for symptomatic anemia.  Hemoccult positive.  CTA chest/abdomen/pelvis concerning for impending AAA rupture per radiology.  Patient was transferred from [facility] to [facility].  Vascular surgery reviewed the CTA and not concerned.   Patient developed acute respiratory distress due to decompensated CHF requiring IV Lasix and BiPAP.  Cardiology consulted.  Diuresed with IV Lasix and transition to p.o. Lasix.  Also started on GDMT.   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   Colonoscopy on 2/17 with polyps (resected and retrieved) diverticulosis and internal hemorrhoids.  On the day of discharge, patient was cleared for discharge by gastroenterology and cardiology.  He was ambulated on room air and maintain appropriate saturation although he easily gets fatigued.  Home health PT and rollator ordered as recommended by therapy.   See individual problem list below for more on hospital course.

## 2021-12-23 NOTE — Assessment & Plan Note (Addendum)
Recent Labs    12/22/21 1941 12/23/21 0530 12/24/21 0645 12/25/21 0205 12/27/21 0620 12/29/21 0147 12/30/21 0235 12/31/21 0132 01/01/22 0207  HGB 7.1* 8.4* 9.9* 10.1* 9.8* 10.0* 9.5* 9.3* 9.7*  -Hgb 13.3 in 02/2021.  Hemoccult positive.  H&H stable after 2 units. -EGD and colonoscopy as above. -Discharged on p.o. Protonix -GI to follow-up on pathology from EGD and colonoscopy.

## 2021-12-23 NOTE — Significant Event (Signed)
Rapid Response Event Note   Reason for Call :  Respiratory Distress, O2 sats 84% on 4L [place] Pt has received 2u PRBC while in ED  Initial Focused Assessment:  Patient is sitting upright in the bed.  He is using accessory muscles to breath.  Lung sounds: crackles through out He denies Chest pain or Abdominal pain  BP 129/70  HR 131  RR 28  O2 sat 98% on NRB.    Dr Avon Gully at bedside to assess patient Dr Carlis Abbott also at bedside to assess patient    Interventions:  NRB O2 sats 98% 40 mg Lasix given IV 12 lead EKG done PCXR Placed on Bipap per RT   Plan of Care:     Event Summary:   MD Notified: Dr Avon Gully Call Time: [place] Time:  6060 End Time:  [place]  Raliegh Ip, RN

## 2021-12-23 NOTE — Progress Notes (Signed)
RT called to pt room due to respiratory distress. Pt placed on bipap 10/5 on 50% and is tolerating well. RT will continue to monitor.

## 2021-12-23 NOTE — Assessment & Plan Note (Addendum)
A1c 5.6%. Recent Labs  Lab 12/31/21 1557 12/31/21 2036 01/01/22 0542 01/01/22 0829 01/01/22 1222  GLUCAP 160* 129* 132* 130* 152*  -Decreased home metformin -Started Farxiga -Continue home statin

## 2021-12-23 NOTE — Progress Notes (Addendum)
Progress Note   Patient: Bob Johnson JXB:[id number] DOB: 10/05/1951 DOA: 12/22/2021     0 DOS: the patient was seen and examined on 12/23/2021   Brief hospital course: 71 year old with past medical history significant for hypertension, hyperlipidemia, PAD, CAD status post stenting, diabetes, aortic ulcer presenting from PCP with abnormal labs. Patient saw his PCP earlier the day of admission I was notified of abnormal lab values and recommended to proceed to the ED.  Patient reports some shortness of breath for the past couple of weeks.  Evaluation in the ED show hemoglobin of 7.2, FOBT positive.  10 months ago hemoglobin was at 13.  GI has been consulted. Recommend endoscopy/Colonoscopy/  Patient has a history of aortic ulcer, we will plan to proceed with CTA chest abdomen and pelvis.   Assessment and Plan: * GI bleed- (present on admission) Patient presents with anemia hemoglobin down to 7.2.  Prior hemoglobin 10 months ago at 13. He was found to have occult blood positive. He denies melena or bloody stool. He  is receiving 2 units of packed red blood cell. GI consulted and recommended endoscopy colonoscopy.  Penetrating atherosclerotic ulcer of aorta [facility]) Patient with history of penetrating ulcer aorta.  He was suppose to have year follow up.  Continue with statins and BP management.  WiIll check CTA due to SOB and anemia  Symptomatic anemia In the setting of positive occult blood. Concern for GI bleed. Getting two unit PRBC>  Will give IV lasix.   Aneurysm of infrarenal abdominal aorta Patient CTA consistent with infrarenal aortic aneurysm inflammation or impending rupture.  Patient denies abdominal pain.  Vascular Surgery consulted, Dr Carlis Abbott. Patient will be transfer to [facility], working with bed placement, might need ED to ED transfer.   Acute on chronic systolic CHF (congestive heart failure) ([place]) Prior ECHO: Ef 40 % He reported SOB after completing blood transfusion.   CTA with pulmonary edema, pleural effusion. Will schedule Lasix IV BID>  Will repeat ECHO.  Continue to hold Entresto, spironolactone and metoprolol.   Hypokalemia Replete orally.   Type 2 diabetes mellitus without complication, without long-term current use of insulin (H[facility]) Monitor CBG. Hold Metformin.   PAD (peripheral artery disease) ([place])- (present on admission) Continue with Lipitor.  Continue to Hold aspirin  Mixed hyperlipidemia- (present on admission) Continue with Lipitor and Zetia.  Essential hypertension- (present on admission) Patient on metoprolol, Entresto and Lasix at home. Hold for now due to soft systolic blood pressure  CAD (coronary artery disease)- (present on admission) Patient past medical history significant for MI and Stent placement.  Continue to hold aspirin in the setting of possible GI bleed.   Holding metoprolol and Lasix due to soft systolic blood pressure.        Subjective: he denies blood in the stool, or chest pain. He has been having some SOB for 2 weeks.  He also reporter later worsening dyspnea improved with oxygen.    Physical Exam: Vitals:   12/23/21 1115 12/23/21 1130 12/23/21 1215 12/23/21 1230  BP:      Pulse: 95 (!) 103 99 86  Resp:      Temp:      TempSrc:      SpO2: 93% (!) 55% (!) 89% (!) 88%  Weight:      Height:       General; Alert.  CVS; S 1 S 2 RRR Lungs No wheezing , crackles bases.   Data Reviewed:  Cbc , prior records reviewed, care discussed  with Dr Michail Sermon.   Family Communication: Crae discussed with Wife who was at bedside.   Disposition: Status is: Observation The patient remains OBS appropriate and will d/c before 2 midnights.        Planned Discharge Destination: Home     Time spent: 45 minutes  Author: Elmarie Shiley, MD 12/23/2021 1:32 PM  For on call review [email or web address].

## 2021-12-24 ENCOUNTER — Encounter (HOSPITAL_COMMUNITY): Payer: Self-pay | Admitting: Internal Medicine

## 2021-12-24 ENCOUNTER — Observation Stay (HOSPITAL_COMMUNITY): Payer: Medicare Other

## 2021-12-24 ENCOUNTER — Encounter (HOSPITAL_COMMUNITY)
Admission: EM | Disposition: A | Discharge status: Home Health | Payer: Self-pay | Source: Ambulatory Visit | Attending: Internal Medicine

## 2021-12-24 DIAGNOSIS — I7409 Other arterial embolism and thrombosis of abdominal aorta: Secondary | ICD-10-CM | POA: Diagnosis not present

## 2021-12-24 DIAGNOSIS — N1831 Chronic kidney disease, stage 3a: Secondary | ICD-10-CM | POA: Diagnosis present

## 2021-12-24 DIAGNOSIS — I130 Hypertensive heart and chronic kidney disease with heart failure and stage 1 through stage 4 chronic kidney disease, or unspecified chronic kidney disease: Secondary | ICD-10-CM | POA: Diagnosis present

## 2021-12-24 DIAGNOSIS — D490 Neoplasm of unspecified behavior of digestive system: Secondary | ICD-10-CM | POA: Diagnosis not present

## 2021-12-24 DIAGNOSIS — K2970 Gastritis, unspecified, without bleeding: Secondary | ICD-10-CM | POA: Diagnosis not present

## 2021-12-24 DIAGNOSIS — I248 Other forms of acute ischemic heart disease: Secondary | ICD-10-CM | POA: Diagnosis present

## 2021-12-24 DIAGNOSIS — E440 Moderate protein-calorie malnutrition: Secondary | ICD-10-CM | POA: Diagnosis present

## 2021-12-24 DIAGNOSIS — M1711 Unilateral primary osteoarthritis, right knee: Secondary | ICD-10-CM | POA: Diagnosis present

## 2021-12-24 DIAGNOSIS — L89312 Pressure ulcer of right buttock, stage 2: Secondary | ICD-10-CM | POA: Diagnosis present

## 2021-12-24 DIAGNOSIS — I5043 Acute on chronic combined systolic (congestive) and diastolic (congestive) heart failure: Secondary | ICD-10-CM | POA: Diagnosis present

## 2021-12-24 DIAGNOSIS — J90 Pleural effusion, not elsewhere classified: Secondary | ICD-10-CM | POA: Diagnosis present

## 2021-12-24 DIAGNOSIS — I255 Ischemic cardiomyopathy: Secondary | ICD-10-CM | POA: Diagnosis present

## 2021-12-24 DIAGNOSIS — K621 Rectal polyp: Secondary | ICD-10-CM | POA: Diagnosis present

## 2021-12-24 DIAGNOSIS — I714 Abdominal aortic aneurysm, without rupture, unspecified: Secondary | ICD-10-CM | POA: Diagnosis not present

## 2021-12-24 DIAGNOSIS — I5023 Acute on chronic systolic (congestive) heart failure: Secondary | ICD-10-CM

## 2021-12-24 DIAGNOSIS — E1151 Type 2 diabetes mellitus with diabetic peripheral angiopathy without gangrene: Secondary | ICD-10-CM | POA: Diagnosis present

## 2021-12-24 DIAGNOSIS — D649 Anemia, unspecified: Secondary | ICD-10-CM | POA: Diagnosis present

## 2021-12-24 DIAGNOSIS — J9601 Acute respiratory failure with hypoxia: Secondary | ICD-10-CM | POA: Diagnosis not present

## 2021-12-24 DIAGNOSIS — R7989 Other specified abnormal findings of blood chemistry: Secondary | ICD-10-CM | POA: Diagnosis not present

## 2021-12-24 DIAGNOSIS — Z20822 Contact with and (suspected) exposure to covid-19: Secondary | ICD-10-CM | POA: Diagnosis present

## 2021-12-24 DIAGNOSIS — D62 Acute posthemorrhagic anemia: Secondary | ICD-10-CM | POA: Diagnosis present

## 2021-12-24 DIAGNOSIS — E876 Hypokalemia: Secondary | ICD-10-CM

## 2021-12-24 DIAGNOSIS — R778 Other specified abnormalities of plasma proteins: Secondary | ICD-10-CM | POA: Diagnosis not present

## 2021-12-24 DIAGNOSIS — K5520 Angiodysplasia of colon without hemorrhage: Secondary | ICD-10-CM | POA: Diagnosis present

## 2021-12-24 DIAGNOSIS — I2510 Atherosclerotic heart disease of native coronary artery without angina pectoris: Secondary | ICD-10-CM | POA: Diagnosis present

## 2021-12-24 DIAGNOSIS — I7143 Infrarenal abdominal aortic aneurysm, without rupture: Secondary | ICD-10-CM | POA: Diagnosis present

## 2021-12-24 DIAGNOSIS — I10 Essential (primary) hypertension: Secondary | ICD-10-CM | POA: Diagnosis not present

## 2021-12-24 DIAGNOSIS — I252 Old myocardial infarction: Secondary | ICD-10-CM | POA: Diagnosis not present

## 2021-12-24 DIAGNOSIS — C155 Malignant neoplasm of lower third of esophagus: Secondary | ICD-10-CM | POA: Diagnosis not present

## 2021-12-24 DIAGNOSIS — K648 Other hemorrhoids: Secondary | ICD-10-CM | POA: Diagnosis present

## 2021-12-24 DIAGNOSIS — E1122 Type 2 diabetes mellitus with diabetic chronic kidney disease: Secondary | ICD-10-CM | POA: Diagnosis present

## 2021-12-24 DIAGNOSIS — E782 Mixed hyperlipidemia: Secondary | ICD-10-CM | POA: Diagnosis present

## 2021-12-24 DIAGNOSIS — C160 Malignant neoplasm of cardia: Secondary | ICD-10-CM | POA: Diagnosis present

## 2021-12-24 DIAGNOSIS — K644 Residual hemorrhoidal skin tags: Secondary | ICD-10-CM | POA: Diagnosis present

## 2021-12-24 DIAGNOSIS — K635 Polyp of colon: Secondary | ICD-10-CM | POA: Diagnosis present

## 2021-12-24 LAB — CBC
HCT: 31.7 % — ABNORMAL LOW (ref 39.0–52.0)
Hemoglobin: 9.9 g/dL — ABNORMAL LOW (ref 13.0–17.0)
MCH: 28.5 pg (ref 26.0–34.0)
MCHC: 31.2 g/dL (ref 30.0–36.0)
MCV: 91.4 fL (ref 80.0–100.0)
Platelets: 204 10*3/uL (ref 150–400)
RBC: 3.47 MIL/uL — ABNORMAL LOW (ref 4.22–5.81)
RDW: 16.1 % — ABNORMAL HIGH (ref 11.5–15.5)
WBC: 11.9 10*3/uL — ABNORMAL HIGH (ref 4.0–10.5)
nRBC: 0 % (ref 0.0–0.2)

## 2021-12-24 LAB — BASIC METABOLIC PANEL
Anion gap: 13 (ref 5–15)
BUN: 20 mg/dL (ref 8–23)
CO2: 21 mmol/L — ABNORMAL LOW (ref 22–32)
Calcium: 8.4 mg/dL — ABNORMAL LOW (ref 8.9–10.3)
Chloride: 110 mmol/L (ref 98–111)
Creatinine, Ser: 1.44 mg/dL — ABNORMAL HIGH (ref 0.61–1.24)
GFR, Estimated: 52 mL/min — ABNORMAL LOW (ref >60)
Glucose, Bld: 108 mg/dL — ABNORMAL HIGH (ref 70–99)
Potassium: 4.1 mmol/L (ref 3.5–5.1)
Sodium: 144 mmol/L (ref 135–145)

## 2021-12-24 LAB — BPAM RBC
Blood Product Expiration Date: 202303132359
Blood Product Expiration Date: 202303132359
ISSUE DATE / TIME: 202302090010
ISSUE DATE / TIME: 202302090601
Unit Type and Rh: 5100
Unit Type and Rh: 5100

## 2021-12-24 LAB — TYPE AND SCREEN
ABO/RH(D): O POS
Antibody Screen: NEGATIVE
Unit division: 0
Unit division: 0

## 2021-12-24 LAB — ECHOCARDIOGRAM COMPLETE
AR max vel: 2.26 cm2
AV Peak grad: 7.1 mmHg
Ao pk vel: 1.33 m/s
Area-P 1/2: 4.99 cm2
Calc EF: 27.1 %
Height: 73 in
S' Lateral: 5 cm
Single Plane A2C EF: 26.6 %
Single Plane A4C EF: 26.3 %
Weight: 2716.07 oz

## 2021-12-24 LAB — ANCA TITERS
Atypical P-ANCA titer: <1:20 {titer}
C-ANCA: <1:20 {titer}
P-ANCA: <1:20 {titer}

## 2021-12-24 LAB — GLUCOSE, CAPILLARY
Glucose-Capillary: 122 mg/dL — ABNORMAL HIGH (ref 70–99)
Glucose-Capillary: 150 mg/dL — ABNORMAL HIGH (ref 70–99)
Glucose-Capillary: 107 mg/dL — ABNORMAL HIGH (ref 70–99)
Glucose-Capillary: 174 mg/dL — ABNORMAL HIGH (ref 70–99)

## 2021-12-24 LAB — VITAMIN B12: Vitamin B-12: 460 pg/mL (ref 180–914)

## 2021-12-24 LAB — TROPONIN I (HIGH SENSITIVITY)
Troponin I (High Sensitivity): 270 ng/L (ref <18)
Troponin I (High Sensitivity): 302 ng/L (ref <18)

## 2021-12-24 LAB — ANA W/REFLEX IF POSITIVE: Anti Nuclear Antibody (ANA): NEGATIVE

## 2021-12-24 LAB — FOLATE: Folate: 15.2 ng/mL (ref >5.9)

## 2021-12-24 SURGERY — COLONOSCOPY
Anesthesia: Monitor Anesthesia Care

## 2021-12-24 MED ORDER — DAPAGLIFLOZIN PROPANEDIOL 10 MG PO TABS
10.0000 mg | ORAL_TABLET | Freq: Every day | ORAL | Status: DC
  Administered 2021-12-24 – 2021-12-29 (×6): 10 mg via ORAL
  Filled 2021-12-24 (×7): qty 1

## 2021-12-24 MED ORDER — SPIRONOLACTONE 12.5 MG HALF TABLET
12.5000 mg | ORAL_TABLET | Freq: Every day | ORAL | Status: DC
  Administered 2021-12-24 – 2022-01-01 (×9): 12.5 mg via ORAL
  Filled 2021-12-24 (×9): qty 1

## 2021-12-24 MED ORDER — FUROSEMIDE 10 MG/ML IJ SOLN
40.0000 mg | Freq: Two times a day (BID) | INTRAMUSCULAR | Status: DC
  Administered 2021-12-24 – 2021-12-29 (×10): 40 mg via INTRAVENOUS
  Filled 2021-12-24 (×10): qty 4

## 2021-12-24 MED ORDER — PERFLUTREN LIPID MICROSPHERE
1.0000 mL | INTRAVENOUS | Status: AC | PRN
  Administered 2021-12-24: 2 mL via INTRAVENOUS
  Filled 2021-12-24: qty 10

## 2021-12-24 NOTE — Plan of Care (Signed)
°  Problem: Clinical Measurements: Goal: Respiratory complications will improve Outcome: Progressing   Problem: Activity: Goal: Risk for activity intolerance will decrease Outcome: Progressing   Problem: Nutrition: Goal: Adequate nutrition will be maintained Outcome: Progressing   Problem: Elimination: Goal: Will not experience complications related to bowel motility Outcome: Progressing   Problem: Pain Managment: Goal: General experience of comfort will improve Outcome: Progressing

## 2021-12-24 NOTE — Assessment & Plan Note (Addendum)
Likely due to CHF. -Diuretics as above.

## 2021-12-24 NOTE — Consult Note (Signed)
Cardiology Consultation:   Patient ID: Bob Johnson MRN: [id number]; DOB: 10/25/1951  Admit date: 12/22/2021 Date of Consult: 12/24/2021  PCP:  Bob Johnson, [place] [facility] Cardiologist:  New}     Patient Profile:   Bob Johnson is a 71 y.o. male with a hx of hypertension, hyperlipidemia, ischemic cardiomyopathy, congestive heart failure, coronary artery disease, peripheral vascular disease, diabetes mellitus admitted with GI bleed who is being seen 12/24/2021 for the evaluation of congestive heart failure at the request of Bob Leep MD.  History of Present Illness:   Patient followed at [facility].  Patient presented with acute anterior myocardial infarction June 2012.  Cardiac catheterization showed total LAD, total circumflex, and 70% right coronary artery.  Patient had PCI of LAD.  Last echocardiogram October 2020 showed mild LV dysfunction with ejection fraction 40%, akinesis of the septum and inferior wall, apical hypokinesis, mild left ventricular hypertrophy, grade 1 diastolic dysfunction, mild left atrial enlargement, mild tricuspid regurgitation.  Patient seen recently by primary care and laboratory showed hemoglobin 7.1.  Patient was treated with 2 units of IV packed red blood cells and his congestive heart failure medications were held.  He subsequent developed respiratory distress requiring BiPAP and diuresis.  Cardiology now asked to evaluate.  Note patient states that for approximately 3 weeks prior to admission he noticed increased dyspnea on exertion and orthopnea.  No pedal edema, chest pain or syncope.  He denies melena, hematochezia or hematemesis.  When he became short of breath last evening he also denied any chest pain or palpitations.  He has improved with diuresis.   Past Medical History:  Diagnosis Date   Congestive heart failure (CHF) ([place])    Coronary artery disease    Diabetes mellitus ([place])    Hyperlipemia    Hypertension     Ischemic cardiomyopathy    MI (myocardial infarction) ([place])    Peripheral vascular disease ([place])     Past Surgical History:  Procedure Laterality Date   No prior surgery       Inpatient Medications: Scheduled Meds:  atorvastatin  80 mg Oral Daily   ezetimibe  10 mg Oral Daily   furosemide  40 mg Intravenous Q12H   guaiFENesin  600 mg Oral BID   insulin aspart  0-9 Units Subcutaneous TID WC   sodium chloride flush  3 mL Intravenous Q12H   Continuous Infusions:  sodium chloride     pantoprazole 8 mg/hr (12/24/21 0559)   PRN Meds: acetaminophen **OR** acetaminophen, ipratropium-albuterol  Allergies:   No Known Allergies  Social History:   Social History   Socioeconomic History   Marital status: Married    Spouse name: Not on file   Number of children: 2   Years of education: Not on file   Highest education level: Not on file  Occupational History   Not on file  Tobacco Use   Smoking status: Former    Types: Cigarettes   Smokeless tobacco: Never  Substance and Sexual Activity   Alcohol use: Never   Drug use: Never   Sexual activity: Not Currently  Other Topics Concern   Not on file  Social History Narrative   Not on file   Social Determinants of Health   Financial Resource Strain: Not on file  Food Insecurity: Not on file  Transportation Needs: Not on file  Physical Activity: Not on file  Stress: Not on file  Social Connections: Not on file  Intimate Partner Violence: Not on  file    Family History:    Family History  Problem Relation Age of Onset   Cancer Mother    Heart attack Father    Alcohol abuse Father      ROS:  Please see the history of present illness.  No melena, hematochezia or hematemesis. All other ROS reviewed and negative.     Physical Exam/Data:   Vitals:   12/24/21 0408 12/24/21 0753 12/24/21 0831 12/24/21 1139  BP: 101/64 (!) 106/59 (!) 106/59 116/62  Pulse: 92 86 93 99  Resp: (!) 24 20 16 20   Temp: 97.7 F (36.5 C) 98  F (36.7 C)  97.8 F (36.6 C)  TempSrc: Axillary Oral  Oral  SpO2: 98% 100% 100% 99%  Weight:      Height:        Intake/Output Summary (Last 24 hours) at 12/24/2021 1226 Last data filed at 12/24/2021 1142 Gross per 24 hour  Intake 720 ml  Output 1500 ml  Net -780 ml   Last 3 Weights 12/23/2021 12/22/2021  Weight (lbs) 169 lb 12.1 oz 168 lb  Weight (kg) 77 kg 76.204 kg     Body mass index is 22.4 kg/m.  General:  Well nourished, well developed, in no acute distress HEENT: normal Neck: supple Vascular: No carotid bruits; Distal pulses diminished Cardiac:  normal S1, S2; RRR; positive gallop; no murmur Lungs: Diminished breath sounds bases Abd: soft, nontender, no hepatomegaly  Ext: no edema Musculoskeletal:  No deformities, BUE and BLE strength normal and equal Skin: warm and dry  Neuro:  CNs 2-12 intact, no focal abnormalities noted Psych:  Normal affect   EKG:  The EKG was personally reviewed and demonstrates: Somewhat difficult but appears to show sinus tachycardia with PACs and right bundle branch block, prior inferior infarct cannot be excluded.  Nonspecific ST changes. Telemetry:  Telemetry was personally reviewed and demonstrates: Sinus rhythm with occasional PVC  Laboratory Data: Chemistry Recent Labs  Lab 12/22/21 1941 12/23/21 0530 12/24/21 0645  NA 143 142 144  K 3.3* 3.4* 4.1  CL 106 107 110  CO2 23 23 21*  GLUCOSE 160* 107* 108*  BUN 29* 26* 20  CREATININE 1.40* 1.24 1.44*  CALCIUM 8.9 8.6* 8.4*  GFRNONAA 54* >60 52*  ANIONGAP 14 12 13     Recent Labs  Lab 12/23/21 0530  PROT 6.7  ALBUMIN 3.0*  AST 15  ALT 9  ALKPHOS 58  BILITOT 0.4   Hematology Recent Labs  Lab 12/22/21 1941 12/22/21 2148 12/23/21 0530 12/24/21 0645  WBC 11.4*  --  10.1 11.9*  RBC 2.48* 2.51* 2.95* 3.47*  HGB 7.1*  --  8.4* 9.9*  HCT 23.0*  --  26.5* 31.7*  MCV 92.7  --  89.8 91.4  MCH 28.6  --  28.5 28.5  MCHC 30.9  --  31.7 31.2  RDW 15.9*  --  16.4* 16.1*  PLT  275  --  206 204     Radiology/Studies:  DG CHEST PORT 1 VIEW  Result Date: 12/23/2021 CLINICAL DATA:  Tachypnea EXAM: PORTABLE CHEST 1 VIEW COMPARISON:  CT 12/23/2021 FINDINGS: Cardiomegaly with vascular congestion and diffuse interstitial and ground-glass opacities suspicious for pulmonary edema. Small moderate bilateral effusions. Airspace disease at the bases. No pneumothorax. IMPRESSION: 1. Cardiomegaly with vascular congestion and diffuse interstitial and ground-glass opacity suspicious for pulmonary edema. Small-moderate bilateral effusions with basilar airspace disease Electronically Signed   By: Donavan Foil M.D.   On: 12/23/2021 17:02   CT  Angio Chest/Abd/Pel for Dissection W and/or W/WO  Result Date: 12/23/2021 CLINICAL DATA:  Aortic dissection, clinical status change EXAM: CT ANGIOGRAPHY CHEST, ABDOMEN AND PELVIS TECHNIQUE: Non-contrast CT of the chest was initially obtained. Multidetector CT imaging through the chest, abdomen and pelvis was performed using the standard protocol during bolus administration of intravenous contrast. Multiplanar reconstructed images and MIPs were obtained and reviewed to evaluate the vascular anatomy. RADIATION DOSE REDUCTION: This exam was performed according to the departmental dose-optimization program which includes automated exposure control, adjustment of the mA and/or kV according to patient size and/or use of iterative reconstruction technique. CONTRAST:  168mL OMNIPAQUE IOHEXOL 350 MG/ML SOLN COMPARISON:  None. FINDINGS: CTA CHEST FINDINGS Cardiovascular: Normal cardiac size. Trace pericardial fluid. Normal size main and branch pulmonary arteries. No evidence of pulmonary embolism. Motion artifact in the ascending aorta. There is moderate calcified atherosclerosis of the aortic arch. 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 This is not in association with  an aberrant subclavian artery. No evidence of thoracic aortic aneurysm, dissection, or intramural hematoma. There are multiple prominent venous collateral vessels in the chest primarily on the right, suggestive of possible right brachiocephalic/subclavian vein narrowing, possibly as it passes under the clavicle. Mediastinum/Nodes: There are multiple enlarged mediastinal and hilar lymph nodes. For reference, a prevascular lymph node measures 1.3 cm (series 7, image 45). Left paratracheal lymph node measures 1.1 cm (series 7, image 48). Lungs/Pleura: Moderate right and small left pleural effusions with adjacent atelectasis. Diffuse bronchial wall thickening, severe in the lower lungs. Diffuse interlobular septal thickening and ground-glass. Moderate centrilobular and paraseptal emphysema. Biapical pleuroparenchymal scarring. No visible pulmonary nodule. Musculoskeletal: No acute osseous abnormality. No suspicious lytic or blastic lesion. There is an age-indeterminate anterior compression deformity of T8 with a proximally 20% height loss. Review of the MIP images confirms the above findings. CTA ABDOMEN AND PELVIS FINDINGS VASCULAR Aorta: There is an infrarenal abdominal aortic aneurysm with lobular mural thrombus. This measures at maximum 3.8 x 3.7 cm (series 7, image 141). The mural thrombus results in mild-to-moderate focal stenosis. There is hyperdense thickening of the aortic wall (series 7, image 140). There is possible mild adjacent stranding. Celiac: Patent without stenosis. SMA: Patent without significant stenosis. Renals: Patent without significant stenosis. There are 2 small left renal arteries. IMA: Patent with mild-to-moderate stenosis at the IMA ostia which originates at the level of the aortic aneurysm. Inflow: Patent without significant stenosis. Veins: No obvious venous abnormality within the limitations of this arterial phase study. Review of the MIP images confirms the above findings. NON-VASCULAR  Hepatobiliary: No focal liver abnormality is seen. No gallstones, gallbladder wall thickening, or biliary dilatation. Pancreas: Unremarkable. No pancreatic ductal dilatation or surrounding inflammatory changes. Spleen: Normal in size without focal abnormality. Adrenals/Urinary Tract: There is bilateral adrenal gland thickening, left worse than right. Probable 1.4 cm left adrenal adenoma (coronal image 90). No hydronephrosis or nephrolithiasis. The bladder is well distended but otherwise unremarkable. Stomach/Bowel: The stomach is within normal limits. There is no evidence of bowel obstruction.The appendix is normal. There is nonspecific mesenteric haziness. Lymphatic: There are multiple enlarged retroperitoneal lymph nodes. For reference: Left periaortic lymph nodes measures 1.2 cm and 1.0 cm (series 7, images 131 and 132). Reproductive: Mildly enlarged prostate. Other: There is a small fat containing left inguinal hernia. Musculoskeletal: No acute or significant osseous findings. Review of the MIP images confirms the above findings. IMPRESSION: Infrarenal  abdominal aortic aneurysm measuring up to 3.8 x 3.7 cm, with significant mural thrombus. There is hyperdense aortic wall thickening, concerning for a high attenuation crescent sign, which is a sign of impending AAA rupture, although rupture would be atypical at this size of aneurysm. There is adjacent mild periaortic stranding and retroperitoneal adenopathy, suggesting this could alternatively represent aortitis. Recommend vascular surgery consultation and correlation with inflammatory markers. Correlation with prior imaging, if available, would be useful. Moderate pulmonary edema with moderate right and small left pleural effusions and adjacent basilar atelectasis. Prominent mediastinal and hilar lymph nodes, which are favored to be reactive. Attention on follow-up exam. Age-indeterminate anterior compression deformity of T8 with approximately 20% height loss.  These results were called by telephone at the time of interpretation on 12/23/2021 at 12:54 pm to provider [facility] , who verbally acknowledged these results. Electronically Signed   By: Maurine Simmering M.D.   On: 12/23/2021 12:58     Assessment and Plan:   Acute on chronic systolic congestive heart failure-patient had increased dyspnea on exertion at time of admission likely exacerbated by acute anemia.  He was then transfused at time of admission with 2 units of packed red blood cells and then became extremely dyspneic and developed pulmonary edema.  He has improved with diuresis.  We will continue Lasix 40 mg IV twice daily.  Add spironolactone 12.5 mg p.o. daily.  Add Farxiga 10 mg daily.  Follow renal function closely.  Follow-up echocardiogram pending.  We will also check troponins for completeness.  There will likely be at least a small increase given recent pulmonary edema.  Note his electrocardiogram was interpreted as atrial fibrillation.  However I have personally reviewed this and feel that it is likely sinus tachycardia with PACs.  He is in sinus rhythm on telemetry and will continue to follow. Normocytic anemia-heme positive at time of admission.  Plan is ultimately for endoscopy.  Continue Protonix.  Gastroenterology following.  If he requires additional transfusion would give Lasix 40 mg after each unit. Coronary artery disease-aspirin on hold given possible GI bleed.  Continue statin. Ischemic cardiomyopathy-patient's Entresto and Toprol on hold as his blood pressure was borderline at time of admission.  Would resume in next 24 hours if blood pressure remains stable. History of hypertension-blood pressure is borderline.  Resume Entresto and Toprol later as blood pressure allows. Hyperlipidemia-continue statin. Abdominal aortic aneurysm-seen by vascular surgery and no intervention felt necessary at this time.  He will need follow-up as an outpatient.   Risk Assessment/Risk Scores:       New York Heart Association (NYHA) Functional Class NYHA Class III        For questions or updates, please contact [facility] Please consult [email or web address] for contact info under    Signed, Kirk Ruths, MD  12/24/2021 12:26 PM

## 2021-12-24 NOTE — Assessment & Plan Note (Addendum)
Recent Labs    12/23/21 0530 12/24/21 0645 12/25/21 0205 12/26/21 0117 12/27/21 0620 12/28/21 0205 12/29/21 0147 12/30/21 0235 12/31/21 0132 01/01/22 0207  BUN 26* 20 21 19 20 21 22 23 22 19   CREATININE 1.24 1.44* 1.47* 1.54* 1.58* 1.53* 1.53* 1.52* 1.46* 1.41*  -Recheck renal function in 1 week.

## 2021-12-24 NOTE — Progress Notes (Addendum)
[facility] [place]  Subjective  - No abdominal pain or lumbar pain.   Objective (!) 106/59 93 98 F (36.7 C) (Oral) 16 100%  Intake/Output Summary (Last 24 hours) at 12/24/2021 0942 Last data filed at 12/24/2021 0750 Gross per 24 hour  Intake 240 ml  Output 1350 ml  Net -1110 ml    Moving all 4 ext.  Palpable radial pulses  Feet warm with intact doppler signals Abdomin soft NTTP, non distended Lungs non labored breathing supported wit O2  3 L  Assessment/Planning: 3.8 cm infrarenal abdominal aortic aneurysm  Asymptomatic stable without evidence of rupture.  Plan for 6 months with AAA duplex.  Our office will call with F/U appointment.   Will continue to follow.  Roxy Horseman 12/24/2021 9:42 AM --  Laboratory Lab Results: Recent Labs    12/23/21 0530 12/24/21 0645  WBC 10.1 11.9*  HGB 8.4* 9.9*  HCT 26.5* 31.7*  PLT 206 204   BMET Recent Labs    12/23/21 0530 12/24/21 0645  NA 142 144  K 3.4* 4.1  CL 107 110  CO2 23 21*  GLUCOSE 107* 108*  BUN 26* 20  CREATININE 1.24 1.44*  CALCIUM 8.6* 8.4*    COAG No results found for: INR, PROTIME No results found for: PTT  I have seen and evaluated the patient. I agree with the PA note as documented above.  71 year old male presented to [facility] ED yesterday with dyspnea and new onset anemia.  CT abdomen pelvis was obtained with concern for abdominal aortic aneurysm with some hyperdensity, mural thrombus and possible stranding.  Again he has no abdominal pain or back pain on exam this morning.  He has no pain with palpation of his aneurysm.  This was identified on previous CT scan in 2021 at [facility].  Imaging here shows a 3.8 cm infrarenal abdominal aortic aneurysm with mural thrombus which is expected.  I do not see any indication for surgical intervention and discussed standard recommendations are to repair these at greater than 5.5 cm.  We will follow him while he is here but  again will recommend outpatient follow-up.  Marty Heck, MD [facility] [place] Office: [phone]

## 2021-12-24 NOTE — Assessment & Plan Note (Addendum)
Likely due to the above.  Resolved.  Ambulated on room air and maintained appropriate saturation. -Manage CHF as above

## 2021-12-24 NOTE — Progress Notes (Signed)
°  Transition of Care [facility]) Screening Note   Patient Details  Name: Chriss Mannan Date of Birth: 04-Aug-1951   Transition of Care [facility]) CM/SW Contact:    Dawayne Patricia, RN Phone Number: 12/24/2021, 3:55 PM    Transition of Care Department [facility]) has reviewed patient and no TOC needs have been identified at this time. We will continue to monitor patient advancement through interdisciplinary progression rounds. If new patient transition needs arise, please place a TOC consult.

## 2021-12-24 NOTE — Progress Notes (Addendum)
[facility] Gastroenterology Progress Note  Bob Johnson 71 y.o. 1950/11/16  CC:  Symptomatic anemia, heme-positive stool   Subjective: Patient resting comfortably in bed, his wife is at bedside.  He states he has been feeling short of breath but is now on 3 L of oxygen by nasal cannula and feeling better.  Denies oxygen use at home.  Denies nausea, vomiting, abdominal pain.  He has not had a bowel movement since being in the hospital.   ROS : Review of Systems  Constitutional:  Negative for chills and fever.  Respiratory:  Positive for shortness of breath.   Gastrointestinal:  Negative for abdominal pain, constipation, diarrhea, heartburn, nausea and vomiting.  Neurological:  Negative for dizziness.     Objective: Vital signs in last 24 hours: Vitals:   12/24/21 0753 12/24/21 0831  BP: (!) 106/59 (!) 106/59  Pulse: 86 93  Resp: 20 16  Temp: 98 F (36.7 C)   SpO2: 100% 100%    Physical Exam:  General:  Alert, cooperative, thin, elderly, no distress  Head:  Normocephalic, without obvious abnormality, atraumatic  Eyes:  Anicteric sclera, EOM's intact  Lungs:   Clear to auscultation bilaterally, respirations unlabored  Heart:  Regular rate and rhythm, S1, S2 normal  Abdomen:   Soft, non-tender, bowel sounds active all four quadrants,  no masses,   Extremities: Extremities normal, atraumatic, no  edema       Lab Results: Recent Labs    12/23/21 0530 12/24/21 0645  NA 142 144  K 3.4* 4.1  CL 107 110  CO2 23 21*  GLUCOSE 107* 108*  BUN 26* 20  CREATININE 1.24 1.44*  CALCIUM 8.6* 8.4*   Recent Labs    12/23/21 0530  AST 15  ALT 9  ALKPHOS 58  BILITOT 0.4  PROT 6.7  ALBUMIN 3.0*   Recent Labs    12/23/21 0530 12/24/21 0645  WBC 10.1 11.9*  HGB 8.4* 9.9*  HCT 26.5* 31.7*  MCV 89.8 91.4  PLT 206 204   No results for input(s): LABPROT, INR in the last 72 hours.    Assessment Symptomatic anemia, heme-positive stool - Hgb 9.9, Hct 31.7 - BUN 20, Cr  1.44 - WBC 11.9 - Vitals 2/10 @ 0830: BP 106/59, pulse 86, SpO2 100 on 3L [place] - CT angiogram 2/9 showed 3.8 cm infrarenal abdominal aortic aneurysm with mural thrombus (compared to 3.2 cm aneurysm on CT 07/2020). Patient was transferred to [facility] for vascular surgery evaluation.  - CXR 2/9 shows cardiomegaly with vascular congestion and diffuse interstitial and ground-glass opacity suspicious for pulmonary edema.  - Patient receiving Lasix.  Plan: Vascular surgery not planning for any surgical intervention at this time. Will hold off on procedures (Colon/EGD) until cleared by primary team, and patient is stable. Would most likely be done 2/13 at the earliest. Hemoglobin improved. Continue to monitor CBC and transfuse to keep hemoglobin above 7. Shortness of breath improved on oxygen supplementation. Continue supportive care. Diet per primary team. [facility] will follow.  Angelique Holm PA-C 12/24/2021, 8:47 AM  Contact #  [phone]

## 2021-12-24 NOTE — Assessment & Plan Note (Deleted)
Demand ischemia.  Follow trend.

## 2021-12-24 NOTE — ED Provider Notes (Signed)
[facility] 4E CV SURGICAL PROGRESSIVE CARE Provider Note   CSN: [id number] Arrival date & time: 12/22/21  1917     History  Chief Complaint  Patient presents with   Abnormal Labs     Bob Johnson is a 71 y.o. male.  No realPatient presents to ER due to abnormal labs.  He states that he had blood test sent today by his primary care doctor and was told to go to the ER for low hemoglobin.  He has never had this problem before he states he went to see his doctor because he has been having 1 or 2 weeks of fatigue and shortness of breath on exertion.  Denies any pain denies noticing any blood in his stool or urine.  Denies taking any Motrin or Advil, however takes daily aspirin.  Ports no fevers or cough or vomiting or diarrhea no chest pain or abdominal pain.      Home Medications Prior to Admission medications   Medication Sig Start Date End Date Taking? Authorizing Provider  aspirin 81 MG chewable tablet Chew 81 mg by mouth daily.   Yes [provider]  atorvastatin (LIPITOR) 80 MG tablet Take 80 mg by mouth daily. 11/19/21  Yes [provider]  ENTRESTO 24-26 MG 1 tablet 2 (two) times daily. 12/20/21  Yes [provider]  ezetimibe (ZETIA) 10 MG tablet Take 10 mg by mouth daily. 11/19/21  Yes [provider]  furosemide (LASIX) 40 MG tablet Take 80 mg by mouth daily. 11/19/21  Yes [provider]  metFORMIN (GLUCOPHAGE-XR) 750 MG 24 hr tablet 750 mg 2 (two) times daily. 11/19/21  Yes [provider]  metoprolol succinate (TOPROL-XL) 50 MG 24 hr tablet Take 75 mg by mouth daily. 04/19/21 04/19/22 Yes [provider]  Multiple Vitamin (MULTIVITAMIN) capsule Take 1 capsule by mouth daily.   Yes [provider]  nitroGLYCERIN (NITROSTAT) 0.4 MG SL tablet Place 0.4 mg under the tongue every 5 (five) minutes as needed for chest pain. 04/19/21  Yes [provider]  potassium chloride (KLOR-CON M) 10 MEQ tablet Take 10 mEq by mouth  daily. 02/23/21 02/23/22 Yes [provider]      Allergies    Patient has no known allergies.    Review of Systems   Review of Systems  Constitutional:  Negative for fever.  HENT:  Negative for ear pain and sore throat.   Eyes:  Negative for pain.  Respiratory:  Negative for cough.   Cardiovascular:  Negative for chest pain.  Gastrointestinal:  Negative for abdominal pain.  Genitourinary:  Negative for flank pain.  Musculoskeletal:  Negative for back pain.  Skin:  Negative for color change and rash.  Neurological:  Negative for syncope.  All other systems reviewed and are negative.  Physical Exam Updated Vital Signs BP 105/67 (BP Location: Right Arm)    Pulse 95    Temp 97.8 F (36.6 C) (Oral)    Resp (!) 23    Ht 6\' 1"  (1.854 m)    Wt 77 kg    SpO2 98%    BMI 22.40 kg/m  Physical Exam Constitutional:      Appearance: He is well-developed.  HENT:     Head: Normocephalic.     Nose: Nose normal.  Eyes:     Extraocular Movements: Extraocular movements intact.  Cardiovascular:     Rate and Rhythm: Normal rate.  Pulmonary:     Effort: Pulmonary effort is normal.  Genitourinary:  Rectum: Guaiac result positive.  Skin:    Coloration: Skin is not jaundiced.  Neurological:     Mental Status: He is alert. Mental status is at baseline.    ED Results / Procedures / Treatments   Labs (all labs ordered are listed, but only abnormal results are displayed) Labs Reviewed  CBC - Abnormal; Notable for the following components:      Result Value   WBC 11.4 (*)    RBC 2.48 (*)    Hemoglobin 7.1 (*)    HCT 23.0 (*)    RDW 15.9 (*)    All other components within normal limits  BASIC METABOLIC PANEL - Abnormal; Notable for the following components:   Potassium 3.3 (*)    Glucose, Bld 160 (*)    BUN 29 (*)    Creatinine, Ser 1.40 (*)    GFR, Estimated 54 (*)    All other components within normal limits  CBC - Abnormal; Notable for the following components:   RBC 2.95  (*)    Hemoglobin 8.4 (*)    HCT 26.5 (*)    RDW 16.4 (*)    All other components within normal limits  COMPREHENSIVE METABOLIC PANEL - Abnormal; Notable for the following components:   Potassium 3.4 (*)    Glucose, Bld 107 (*)    BUN 26 (*)    Calcium 8.6 (*)    Albumin 3.0 (*)    All other components within normal limits  IRON AND TIBC - Abnormal; Notable for the following components:   Iron 35 (*)    Saturation Ratios 10 (*)    All other components within normal limits  RETICULOCYTES - Abnormal; Notable for the following components:   Retic Ct Pct 3.8 (*)    RBC. 2.51 (*)    Immature Retic Fract 28.8 (*)    All other components within normal limits  SEDIMENTATION RATE - Abnormal; Notable for the following components:   Sed Rate 65 (*)    All other components within normal limits  C-REACTIVE PROTEIN - Abnormal; Notable for the following components:   CRP 5.7 (*)    All other components within normal limits  GLUCOSE, CAPILLARY - Abnormal; Notable for the following components:   Glucose-Capillary 217 (*)    All other components within normal limits  CBC - Abnormal; Notable for the following components:   WBC 11.9 (*)    RBC 3.47 (*)    Hemoglobin 9.9 (*)    HCT 31.7 (*)    RDW 16.1 (*)    All other components within normal limits  BASIC METABOLIC PANEL - Abnormal; Notable for the following components:   CO2 21 (*)    Glucose, Bld 108 (*)    Creatinine, Ser 1.44 (*)    Calcium 8.4 (*)    GFR, Estimated 52 (*)    All other components within normal limits  GLUCOSE, CAPILLARY - Abnormal; Notable for the following components:   Glucose-Capillary 122 (*)    All other components within normal limits  GLUCOSE, CAPILLARY - Abnormal; Notable for the following components:   Glucose-Capillary 150 (*)    All other components within normal limits  GLUCOSE, CAPILLARY - Abnormal; Notable for the following components:   Glucose-Capillary 107 (*)    All other components within  normal limits  POC OCCULT BLOOD, ED - Abnormal; Notable for the following components:   Fecal Occult Bld POSITIVE (*)    All other components within normal limits  CBG MONITORING,  ED - Abnormal; Notable for the following components:   Glucose-Capillary 101 (*)    All other components within normal limits  CBG MONITORING, ED - Abnormal; Notable for the following components:   Glucose-Capillary 114 (*)    All other components within normal limits  CBG MONITORING, ED - Abnormal; Notable for the following components:   Glucose-Capillary 119 (*)    All other components within normal limits  TROPONIN I (HIGH SENSITIVITY) - Abnormal; Notable for the following components:   Troponin I (High Sensitivity) 270 (*)    All other components within normal limits  TROPONIN I (HIGH SENSITIVITY) - Abnormal; Notable for the following components:   Troponin I (High Sensitivity) 302 (*)    All other components within normal limits  RESP PANEL BY RT-PCR (FLU A&B, COVID) ARPGX2  HIV ANTIBODY (ROUTINE TESTING W REFLEX)  FERRITIN  ANA W/REFLEX IF POSITIVE  ANCA TITERS  VITAMIN B12  FOLATE  TYPE AND SCREEN  ABO/RH  PREPARE RBC (CROSSMATCH)  TYPE AND SCREEN    EKG None  Radiology DG CHEST PORT 1 VIEW  Result Date: 12/23/2021 CLINICAL DATA:  Tachypnea EXAM: PORTABLE CHEST 1 VIEW COMPARISON:  CT 12/23/2021 FINDINGS: Cardiomegaly with vascular congestion and diffuse interstitial and ground-glass opacities suspicious for pulmonary edema. Small moderate bilateral effusions. Airspace disease at the bases. No pneumothorax. IMPRESSION: 1. Cardiomegaly with vascular congestion and diffuse interstitial and ground-glass opacity suspicious for pulmonary edema. Small-moderate bilateral effusions with basilar airspace disease Electronically Signed   By: Donavan Foil M.D.   On: 12/23/2021 17:02   ECHOCARDIOGRAM COMPLETE  Result Date: 12/24/2021    ECHOCARDIOGRAM REPORT   Patient Name:   MEREDITH MELLS Date of Exam:  12/24/2021 Medical Rec #:  [id number]    Height:       73.0 in Accession #:    [id number]   Weight:       169.8 lb Date of Birth:  [birth year]    BSA:          2.007 m Patient Age:    15 years     BP:           108/69 mmHg Patient Gender: M            HR:           101 bpm. Exam Location:  Inpatient Procedure: 2D Echo, Cardiac Doppler, Color Doppler and Intracardiac            Opacification Agent Indications:    CHF  History:        Patient has no prior history of Echocardiogram examinations.                 CAD; Risk Factors:Hypertension and Diabetes.  Sonographer:    Jyl Heinz Referring Phys: [place]  1. Left ventricular ejection fraction, by estimation, is 20 to 25%. The left ventricle has severely decreased function. The left ventricle demonstrates global hypokinesis. The left ventricular internal cavity size was mildly dilated. Left ventricular diastolic parameters are consistent with Grade II diastolic dysfunction (pseudonormalization). Elevated left atrial pressure. 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  2. Right ventricular systolic function is normal. The right ventricular size is normal. There is moderately elevated pulmonary artery systolic pressure.  3. Left atrial size was severely dilated.  4. Right atrial  size was mildly dilated.  5. A small pericardial effusion is present. The pericardial effusion is circumferential. There is no evidence of cardiac tamponade.  6. The mitral valve is normal in structure. Trivial mitral valve regurgitation.  7. Tricuspid valve regurgitation is moderate.  8. The aortic valve is tricuspid. Aortic valve regurgitation is not visualized. No aortic stenosis is present.  9. The inferior vena cava is dilated in size with >50% respiratory variability,  suggesting right atrial pressure of 8 mmHg. FINDINGS  Left Ventricle: No left ventricular thrombus is seen (definity contrast used). Left ventricular ejection fraction, by estimation, is 20 to 25%. The left ventricle has severely decreased function. The left ventricle demonstrates global hypokinesis. The left ventricular internal cavity size was mildly dilated. There is no left ventricular hypertrophy. Left ventricular diastolic parameters are consistent with Grade II diastolic dysfunction (pseudonormalization). Elevated left atrial pressure.  LV Wall Scoring: The inferior septum, entire inferior wall, basal anteroseptal segment, apical anterior segment, and apex are akinetic. The anterior wall, entire lateral wall, and mid anteroseptal segment are hypokinetic. 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 Right Ventricle: The right ventricular size is normal. No increase in right ventricular wall thickness. Right ventricular systolic function is normal. There is moderately elevated pulmonary artery systolic pressure. The tricuspid regurgitant velocity is 3.27 m/s, and with an assumed right atrial pressure of 8 mmHg, the estimated right ventricular systolic pressure is 74.0 mmHg. Left Atrium: Left atrial size was severely dilated. Right Atrium: Right atrial size was mildly dilated. Pericardium: A small pericardial effusion is present. The pericardial effusion is circumferential. There is no evidence of cardiac tamponade. Mitral Valve: The mitral valve is normal in structure. Trivial mitral valve regurgitation. Tricuspid Valve: The tricuspid valve is normal in structure. Tricuspid valve regurgitation is moderate. Aortic Valve: The aortic valve is tricuspid. Aortic valve regurgitation is not visualized. No aortic stenosis is  present. Aortic valve peak gradient measures 7.1 mmHg. Pulmonic Valve: The pulmonic valve was grossly normal. Pulmonic valve regurgitation is not visualized. Aorta: The aortic root and ascending aorta are structurally normal, with no evidence of dilitation. Venous: The inferior vena cava is dilated in size with greater than 50% respiratory variability, suggesting right atrial pressure of 8 mmHg. IAS/Shunts: No atrial level shunt detected by color flow Doppler.  LEFT VENTRICLE PLAX 2D LVIDd:         5.90 cm      Diastology LVIDs:         5.00 cm      LV e' medial:    9.64 cm/s LV PW:         1.00 cm      LV E/e' medial:  9.2 LV IVS:        0.70 cm      LV e' lateral:   11.20 cm/s LVOT diam:     2.00 cm      LV E/e' lateral: 7.9 LV SV:         46 LV SV Index:   23 LVOT Area:     3.14 cm  LV Volumes (MOD) LV vol d, MOD A2C: 237.0 ml LV vol d, MOD A4C: 198.0 ml LV vol s, MOD A2C: 174.0 ml LV vol s, MOD A4C: 146.0 ml LV SV MOD A2C:     63.0 ml LV SV MOD  A4C:     198.0 ml LV SV MOD BP:      59.3 ml RIGHT VENTRICLE             IVC RV Basal diam:  3.60 cm     IVC diam: 2.20 cm RV Mid diam:    2.70 cm RV S prime:     13.40 cm/s TAPSE (M-mode): 1.9 cm LEFT ATRIUM             Index        RIGHT ATRIUM           Index LA diam:        3.90 cm 1.94 cm/m   RA Area:     18.00 cm LA Vol (A2C):   59.3 ml 29.55 ml/m  RA Volume:   48.90 ml  24.37 ml/m LA Vol (A4C):   65.2 ml 32.49 ml/m LA Biplane Vol: 66.7 ml 33.24 ml/m  AORTIC VALVE AV Area (Vmax): 2.26 cm AV Vmax:        133.00 cm/s AV Peak Grad:   7.1 mmHg LVOT Vmax:      95.80 cm/s LVOT Vmean:     61.900 cm/s LVOT VTI:       0.146 m  AORTA Ao Root diam: 3.60 cm Ao Asc diam:  2.80 cm MITRAL VALVE               TRICUSPID VALVE MV Area (PHT): 4.99 cm    TR Peak grad:   42.8 mmHg MV Decel Time: 152 msec    TR Vmax:        327.00 cm/s MV E velocity: 88.60 cm/s MV A velocity: 64.50 cm/s  SHUNTS MV E/A ratio:  1.37        Systemic VTI:  0.15 m                            Systemic  Diam: 2.00 cm Dani Gobble Croitoru MD Electronically signed by Sanda Klein MD Signature Date/Time: 12/24/2021/12:42:44 PM    Final    CT Angio Chest/Abd/Pel for Dissection W and/or W/WO  Result Date: 12/23/2021 CLINICAL DATA:  Aortic dissection, clinical status change EXAM: CT ANGIOGRAPHY CHEST, ABDOMEN AND PELVIS TECHNIQUE: Non-contrast CT of the chest was initially obtained. Multidetector CT imaging through the chest, abdomen and pelvis was performed using the standard protocol during bolus administration of intravenous contrast. Multiplanar reconstructed images and MIPs were obtained and reviewed to evaluate the vascular anatomy. RADIATION DOSE REDUCTION: This exam was performed according to the departmental dose-optimization program which includes automated exposure control, adjustment of the mA and/or kV according to patient size and/or use of iterative reconstruction technique. CONTRAST:  124mL OMNIPAQUE IOHEXOL 350 MG/ML SOLN COMPARISON:  None. FINDINGS: CTA CHEST FINDINGS Cardiovascular: Normal cardiac size. Trace pericardial fluid. Normal size main and branch pulmonary arteries. No evidence of pulmonary embolism. Motion artifact in the ascending aorta. There is moderate calcified atherosclerosis of the aortic arch. 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 This is not in association with an aberrant subclavian artery. No evidence of thoracic aortic aneurysm, dissection, or intramural hematoma. There are multiple prominent venous collateral vessels in the chest primarily on the right, suggestive of possible right brachiocephalic/subclavian vein narrowing, possibly as it passes under the clavicle. Mediastinum/Nodes: There are multiple enlarged mediastinal and hilar lymph nodes. For reference, a prevascular lymph node  measures 1.3 cm (series 7, image 45). Left paratracheal lymph node measures 1.1 cm (series 7,  image 48). Lungs/Pleura: Moderate right and small left pleural effusions with adjacent atelectasis. Diffuse bronchial wall thickening, severe in the lower lungs. Diffuse interlobular septal thickening and ground-glass. Moderate centrilobular and paraseptal emphysema. Biapical pleuroparenchymal scarring. No visible pulmonary nodule. Musculoskeletal: No acute osseous abnormality. No suspicious lytic or blastic lesion. There is an age-indeterminate anterior compression deformity of T8 with a proximally 20% height loss. Review of the MIP images confirms the above findings. CTA ABDOMEN AND PELVIS FINDINGS VASCULAR Aorta: There is an infrarenal abdominal aortic aneurysm with lobular mural thrombus. This measures at maximum 3.8 x 3.7 cm (series 7, image 141). The mural thrombus results in mild-to-moderate focal stenosis. There is hyperdense thickening of the aortic wall (series 7, image 140). There is possible mild adjacent stranding. Celiac: Patent without stenosis. SMA: Patent without significant stenosis. Renals: Patent without significant stenosis. There are 2 small left renal arteries. IMA: Patent with mild-to-moderate stenosis at the IMA ostia which originates at the level of the aortic aneurysm. Inflow: Patent without significant stenosis. Veins: No obvious venous abnormality within the limitations of this arterial phase study. Review of the MIP images confirms the above findings. NON-VASCULAR Hepatobiliary: No focal liver abnormality is seen. No gallstones, gallbladder wall thickening, or biliary dilatation. Pancreas: Unremarkable. No pancreatic ductal dilatation or surrounding inflammatory changes. Spleen: Normal in size without focal abnormality. Adrenals/Urinary Tract: There is bilateral adrenal gland thickening, left worse than right. Probable 1.4 cm left adrenal adenoma (coronal image 90). No hydronephrosis or nephrolithiasis. The bladder is well distended but otherwise unremarkable. Stomach/Bowel: The stomach  is within normal limits. There is no evidence of bowel obstruction.The appendix is normal. There is nonspecific mesenteric haziness. Lymphatic: There are multiple enlarged retroperitoneal lymph nodes. For reference: Left periaortic lymph nodes measures 1.2 cm and 1.0 cm (series 7, images 131 and 132). Reproductive: Mildly enlarged prostate. Other: There is a small fat containing left inguinal hernia. Musculoskeletal: No acute or significant osseous findings. Review of the MIP images confirms the above findings. IMPRESSION: Infrarenal abdominal aortic aneurysm measuring up to 3.8 x 3.7 cm, with significant mural thrombus. There is hyperdense aortic wall thickening, concerning for a high attenuation crescent sign, which is a sign of impending AAA rupture, although rupture would be atypical at this size of aneurysm. There is adjacent mild periaortic stranding and retroperitoneal adenopathy, suggesting this could alternatively represent aortitis. Recommend vascular surgery consultation and correlation with inflammatory markers. Correlation with prior imaging, if available, would be useful. Moderate pulmonary edema with moderate right and small left pleural effusions and adjacent basilar atelectasis. Prominent mediastinal and hilar lymph nodes, which are favored to be reactive. Attention on follow-up exam. Age-indeterminate anterior compression deformity of T8 with approximately 20% height loss. These results were called by telephone at the time of interpretation on 12/23/2021 at 12:54 pm to provider [facility] , who verbally acknowledged these results. Electronically Signed   By: Maurine Simmering M.D.   On: 12/23/2021 12:58    Procedures .Critical Care Performed by: Luna Fuse, MD Authorized by: Luna Fuse, MD   Critical care provider statement:    Critical care time (minutes):  40   Critical care time was exclusive of:  Separately billable procedures and treating other patients and teaching time    Critical care was necessary to treat or prevent imminent or life-threatening deterioration of the following conditions:  Circulatory failure   Critical care was  time spent personally by me on the following activities:  Development of treatment plan with patient or surrogate, discussions with consultants, evaluation of patient's response to treatment, examination of patient, ordering and review of laboratory studies, ordering and review of radiographic studies, ordering and performing treatments and interventions, pulse oximetry, re-evaluation of patient's condition and review of old charts    Medications Ordered in ED Medications  pantoprozole (PROTONIX) 80 mg /NS 100 mL infusion (8 mg/hr Intravenous New Bag/Given 12/24/21 1612)  atorvastatin (LIPITOR) tablet 80 mg (80 mg Oral Given 12/24/21 1003)  ezetimibe (ZETIA) tablet 10 mg (10 mg Oral Given 12/24/21 1004)  sodium chloride flush (NS) 0.9 % injection 3 mL (3 mLs Intravenous Given 12/23/21 2204)  acetaminophen (TYLENOL) tablet 650 mg (has no administration in time range)    Or  acetaminophen (TYLENOL) suppository 650 mg (has no administration in time range)  insulin aspart (novoLOG) injection 0-9 Units (0 Units Subcutaneous Not Given 12/24/21 1614)  potassium chloride 10 mEq in 100 mL IVPB (0 mEq Intravenous Hold 12/23/21 1101)  guaiFENesin (MUCINEX) 12 hr tablet 600 mg (600 mg Oral Given 12/24/21 1004)  ipratropium-albuterol (DUONEB) 0.5-2.5 (3) MG/3ML nebulizer solution 3 mL (has no administration in time range)  perflutren lipid microspheres (DEFINITY) IV suspension (2 mLs Intravenous Given 12/24/21 1002)  furosemide (LASIX) injection 40 mg (has no administration in time range)  spironolactone (ALDACTONE) tablet 12.5 mg (12.5 mg Oral Given 12/24/21 1356)  dapagliflozin propanediol (FARXIGA) tablet 10 mg (10 mg Oral Given 12/24/21 1406)  pantoprazole (PROTONIX) 80 mg /NS 100 mL IVPB (0 mg Intravenous Stopped 12/22/21 2246)  potassium chloride SA  (KLOR-CON M) CR tablet 40 mEq (40 mEq Oral Given 12/23/21 0103)  iohexol (OMNIPAQUE) 350 MG/ML injection 100 mL (100 mLs Intravenous Contrast Given 12/23/21 1149)  furosemide (LASIX) injection 20 mg (20 mg Intravenous Given 12/23/21 1102)  potassium chloride SA (KLOR-CON M) CR tablet 40 mEq (40 mEq Oral Given 12/23/21 1059)  furosemide (LASIX) injection 40 mg (40 mg Intravenous Given 12/23/21 1620)    ED Course/ Medical Decision Making/ A&P                           Medical Decision Making Amount and/or Complexity of Data Reviewed Labs: ordered.  Risk Decision regarding hospitalization.   Chart review shows patient's office visit on December 22, 2021 with a doctor regarding diabetes.  Labs today show significant anemia hemoglobin 7.4.  No prior levels available for comparison.  Given patient's symptoms risk benefits of blood transfusion discussed and patient agreed to receive transfusion.  Rectal exam showed normal colored stool which was guaiac positive, patient started on Protonix.  Patient presents to the hospitalist team for symptomatic anemia requiring emergent blood transfusion and concern for GI bleed.  Case discussed with on-call gastroenterology for evaluation as well.        Final Clinical Impression(s) / ED Diagnoses Final diagnoses:  Symptomatic anemia  Gastrointestinal hemorrhage, unspecified gastrointestinal hemorrhage type    Rx / DC Orders ED Discharge Orders     None         Luna Fuse, MD 12/24/21 [phone]

## 2021-12-24 NOTE — Progress Notes (Addendum)
PROGRESS NOTE   Chukwudi Ewen  TOI:[id number]    DOB: 02/09/1951    DOA: 12/22/2021  PCP: Fransisca Connors, FNP   I have briefly reviewed patients previous medical records in [facility].  Chief Complaint  Patient presents with   Abnormal Labs     Hospital Course:  71 year old married male, recently moved from [place] to [place] in November 2022, PCP and cardiology in [place], medical history significant for CAD, s/p anterior MI 2021 with bare-metal stenting to his LAD, ischemic cardiomyopathy, most recent EF from 2021 of 83%, chronic systolic CHF, HTN, HLD, DM2, PAD, penetrating atherosclerotic ulcer of aorta, former smoker, seen by his PCP on 12/22/2021 with complaints of new increased dyspnea with minimal exertion of 2 weeks duration, decreased appetite, approximately 19 pounds weight loss since November 2022, work-up revealed hemoglobin of 7.2 and patient was directed to ED for further evaluation and management.  Newly admitted for symptomatic anemia with Hemoccult positive stools.  Due to history of penetrating aortic ulcer, CTA chest, abdomen and pelvis performed.  Was read by radiologist as concerning for impending AAA rupture.  Patient transferred from [facility] to [facility], vascular surgery evaluated and are not concerned about any acute issues with the AAA.  On day of transfer 2/9, developed acute respiratory distress due to decompensated CHF, needing IV Lasix, transition to BiPAP.  Treating acute on chronic systolic CHF with IV Lasix.  Cardiology consulted.  [facility] on board and awaiting stabilization and clearance for colonoscopy (patient declined EGD), next possible date is 2/13.     Assessment & Plan:  Principal Problem:   Acute on chronic systolic CHF (congestive heart failure) (HCC) Active Problems:   Acute respiratory failure with hypoxia (HCC)   Symptomatic anemia   Aneurysm of infrarenal abdominal aorta   Bilateral pleural effusion   CAD (coronary artery disease)    Essential hypertension   Elevated troponin   Type 2 diabetes mellitus without complication, without long-term current use of insulin (HCC)   Mixed hyperlipidemia   Chronic kidney disease, stage 3a (HCC)   Hypokalemia   PAD (peripheral artery disease) (HCC)   Assessment and Plan: * Acute on chronic systolic CHF (congestive heart failure) ([place])- (present on admission) Ischemic cardiomyopathy Follows with Dr. Harlow Ohms, Cardiology at [facility]. TTE 09/03/2019: LVEF 40%, with inferior and septal akinesis. TTE 12/24/2021: LVEF 20-25%, LV global hypokinesis, grade 2 diastolic dysfunction.  Obviously worse than prior. Suspect that his presenting symptoms were likely due to decompensated CHF precipitated by worsening anemia. CHF was further worsened by 2 units of PRBC transfusion. Started on IV Lasix 20 mg twice daily on 2/9, increased today to 40 mg twice daily. Cardiology input appreciated: Adding Aldactone 12.5 Mg daily, Farxiga 10 mg daily. Home Toprol-XL and Entresto on hold due to borderline blood pressures on admission.  Resume as able.  Acute respiratory failure with hypoxia [facility]) Upon transfer from [facility] to [facility] on 2/9, patient was in respiratory extremis, rapid response was activated. Acute respiratory failure due to decompensated CHF complicated by bilateral pleural effusions, anemia and blood transfusion. Briefly on BiPAP, has been weaned off to [place] oxygen. Management of CHF and anemia as noted. Titrate oxygen to maintain saturations >92%.  BiPAP as needed. We will need to assess home oxygen needs prior to discharge. Follow chest x-ray in a.m.  Bilateral pleural effusion- (present on admission) Likely due to CHF. Follow chest x-ray in a.m.  Aneurysm of infrarenal abdominal aorta- (present on admission) Patient  underwent CTA of chest abdomen and pelvis to evaluate for anemia given history of penetrating aortic ulcer. CT report was read as impending AAA rupture. Patient  was transferred from [facility] to [facility] for vascular surgery evaluation. As per vascular surgery, patient has a 3.8 cm infrarenal abdominal aortic aneurysm with known mural thrombus.  He is not concerned about aortitis.  No surgical intervention indicated. Outpatient follow-up.  Symptomatic anemia- (present on admission) Iron deficiency anemia: No report of overt GI bleeding. Most recent hemoglobin 13.3 on 02/12/2021.  Presented with hemoglobin of 7.1. FOBT +. Anemia panel: Ferritin 37, will check B12 and folate for completion. S/p 2 units PRBC transfusion and hemoglobin has appropriately improved to 9.9. [facility] were consulted, patient declined EGD, plan colonoscopy pending cardiovascular and respiratory stability and clearance. Aspirin on hold. Continue PPI.  CAD (coronary artery disease)- (present on admission) Ischemic cardiomyopathy Elevated troponin Patient past medical history significant for MI and Stent placement.  Continue to hold aspirin in the setting of possible GI bleed.   Metoprolol on hold due to soft blood pressures. Elevated troponin likely secondary to demand ischemia from decompensated CHF, acute respiratory failure and anemia.  Elevated troponin Demand ischemia.  Follow trend.  Essential hypertension- (present on admission) Patient on metoprolol, Entresto and Lasix at home. Toprol-XL and Entresto on hold due to soft blood pressures. Aldactone 12.5 mg daily added. Also on IV Lasix 40 mg every 12 hours.  Type 2 diabetes mellitus without complication, without long-term current use of insulin ([place]) Holding home metformin. Continue SSI. A1c 5.6 on 12/22/2021 suggesting tight control.  Chronic kidney disease, stage 3a ([place])- (present on admission) Most recent creatinine appears to be in the 1.4-1.5 range. Current creatinine at baseline. Monitor BMP closely while on IV Lasix.  Mixed hyperlipidemia- (present on admission) Continue with Lipitor and Zetia.  Hypokalemia-  (present on admission) Replaced. Follow BMP and magnesium in AM.  PAD (peripheral artery disease) ([place])- (present on admission) Continue statins and Zetia. Aspirin on hold due to FOBT + and anemia.    Body mass index is 22.4 kg/m.  Nutritional Status Nutrition Problem: Inadequate oral intake Etiology: inability to eat, altered GI function Signs/Symptoms: NPO status Interventions: Refer to RD note for recommendations     DVT prophylaxis: SCDs Start: 12/22/21 2125     Code Status: Full Code:  Family Communication: Discussed in detail with spouse at bedside, updated care and answered all questions. Disposition:  Status is: Inpatient Remains inpatient appropriate because: Severity of illness.     Consultants:   [facility] gastroenterology Vascular surgery Cardiology.  Procedures:   BiPAP  Antimicrobials:   None   Subjective:  Seen this morning.  Spouse at bedside.  Per both, progressive dyspnea for the last 3 to 4 weeks, worsened recently with dyspnea on mild exertion, orthopnea and sleeping on a couple of pillows, no leg edema.  Denies blood in stools, melena or emesis.  Former smoker, quit after last MI.  Does not use home oxygen.  Objective:   Vitals:   12/24/21 0753 12/24/21 0831 12/24/21 1139 12/24/21 1516  BP: (!) 106/59 (!) 106/59 116/62 105/67  Pulse: 86 93 99 95  Resp: 20 16 20  (!) 23  Temp: 98 F (36.7 C)  97.8 F (36.6 C)   TempSrc: Oral  Oral Oral  SpO2: 100% 100% 99% 98%  Weight:      Height:        General exam: Elderly male, moderately built and frail sitting up in bed  with mild respiratory distress.  Dyspneic even while speaking. Respiratory system: Clear to auscultation anteriorly.  Diminished breath sounds posteriorly with bibasal fine crackles.  No wheezing or rhonchi.  Mild increased work of breathing. Cardiovascular system: S1 & S2 heard, RRR. No JVD, murmurs, rubs, gallops or clicks. No pedal edema.  Telemetry personally reviewed: Sinus  rhythm with BBB morphology.  Occasional mild sinus tachycardia in the 100s. Gastrointestinal system: Abdomen is nondistended, soft and nontender. No organomegaly or masses felt. Normal bowel sounds heard. Central nervous system: Alert and oriented. No focal neurological deficits. Extremities: Symmetric 5 x 5 power. Skin: No rashes, lesions or ulcers Psychiatry: Judgement and insight appear normal. Mood & affect appropriate.     Data Reviewed:   I have personally reviewed following labs and imaging studies   CBC: Recent Labs  Lab 12/22/21 1941 12/23/21 0530 12/24/21 0645  WBC 11.4* 10.1 11.9*  HGB 7.1* 8.4* 9.9*  HCT 23.0* 26.5* 31.7*  MCV 92.7 89.8 91.4  PLT 275 206 161    Basic Metabolic Panel: Recent Labs  Lab 12/22/21 1941 12/23/21 0530 12/24/21 0645  NA 143 142 144  K 3.3* 3.4* 4.1  CL 106 107 110  CO2 23 23 21*  GLUCOSE 160* 107* 108*  BUN 29* 26* 20  CREATININE 1.40* 1.24 1.44*  CALCIUM 8.9 8.6* 8.4*    Liver Function Tests: Recent Labs  Lab 12/23/21 0530  AST 15  ALT 9  ALKPHOS 58  BILITOT 0.4  PROT 6.7  ALBUMIN 3.0*    CBG: Recent Labs  Lab 12/23/21 1617 12/24/21 0652 12/24/21 1135  GLUCAP 217* 122* 150*    Microbiology Studies:   Recent Results (from the past 240 hour(s))  Resp Panel by RT-PCR (Flu A&B, Covid) Nasopharyngeal Swab     Status: None   Collection Time: 12/22/21  9:45 PM   Specimen: Nasopharyngeal Swab; Nasopharyngeal(NP) swabs in vial transport medium  Result Value Ref Range Status   SARS Coronavirus 2 by RT PCR NEGATIVE NEGATIVE Final    Comment: (NOTE) SARS-CoV-2 target nucleic acids are NOT DETECTED.  The SARS-CoV-2 RNA is generally detectable in upper respiratory specimens during the acute phase of infection. The lowest concentration of SARS-CoV-2 viral copies this assay can detect is 138 copies/mL. A negative result does not preclude SARS-Cov-2 infection and should not be used as the sole basis for treatment  or other patient management decisions. A negative result may occur with  improper specimen collection/handling, submission of specimen other than nasopharyngeal swab, presence of viral mutation(s) within the areas targeted by this assay, and inadequate number of viral copies(<138 copies/mL). A negative result must be combined with clinical observations, patient history, and epidemiological information. The expected result is Negative.  Fact Sheet for Patients:  EntrepreneurPulse.com.au  Fact Sheet for Healthcare Providers:  IncredibleEmployment.be  This test is no t yet approved or cleared by the [place] FDA and  has been authorized for detection and/or diagnosis of SARS-CoV-2 by FDA under an Emergency Use Authorization (EUA). This EUA will remain  in effect (meaning this test can be used) for the duration of the COVID-19 declaration under Section 564(b)(1) of the Act, 21 U.S.C.section 360bbb-3(b)(1), unless the authorization is terminated  or revoked sooner.       Influenza A by PCR NEGATIVE NEGATIVE Final   Influenza B by PCR NEGATIVE NEGATIVE Final    Comment: (NOTE) The Xpert Xpress SARS-CoV-2/FLU/RSV plus assay is intended as an aid in the diagnosis of influenza from Nasopharyngeal swab  specimens and should not be used as a sole basis for treatment. Nasal washings and aspirates are unacceptable for Xpert Xpress SARS-CoV-2/FLU/RSV testing.  Fact Sheet for Patients: EntrepreneurPulse.com.au  Fact Sheet for Healthcare Providers: IncredibleEmployment.be  This test is not yet approved or cleared by the [place] FDA and has been authorized for detection and/or diagnosis of SARS-CoV-2 by FDA under an Emergency Use Authorization (EUA). This EUA will remain in effect (meaning this test can be used) for the duration of the COVID-19 declaration under Section 564(b)(1) of the Act, 21 U.S.C. section  360bbb-3(b)(1), unless the authorization is terminated or revoked.  Performed at [facility], [place] [address] [place], [place] [address]     Radiology Studies:  DG CHEST PORT 1 VIEW  Result Date: 12/23/2021 CLINICAL DATA:  Tachypnea EXAM: PORTABLE CHEST 1 VIEW COMPARISON:  CT 12/23/2021 FINDINGS: Cardiomegaly with vascular congestion and diffuse interstitial and ground-glass opacities suspicious for pulmonary edema. Small moderate bilateral effusions. Airspace disease at the bases. No pneumothorax. IMPRESSION: 1. Cardiomegaly with vascular congestion and diffuse interstitial and ground-glass opacity suspicious for pulmonary edema. Small-moderate bilateral effusions with basilar airspace disease Electronically Signed   By: Donavan Foil M.D.   On: 12/23/2021 17:02   ECHOCARDIOGRAM COMPLETE  Result Date: 12/24/2021    ECHOCARDIOGRAM REPORT   Patient Name:   ERUBIEL MANASCO Date of Exam: 12/24/2021 Medical Rec #:  [id number]    Height:       73.0 in Accession #:    [id number]   Weight:       169.8 lb Date of Birth:  08-Oct-1951    BSA:          2.007 m Patient Age:    55 years     BP:           108/69 mmHg Patient Gender: M            HR:           101 bpm. Exam Location:  Inpatient Procedure: 2D Echo, Cardiac Doppler, Color Doppler and Intracardiac            Opacification Agent Indications:    CHF  History:        Patient has no prior history of Echocardiogram examinations.                 CAD; Risk Factors:Hypertension and Diabetes.  Sonographer:    Jyl Heinz Referring Phys: [place]  1. Left ventricular ejection fraction, by estimation, is 20 to 25%. The left ventricle has severely decreased function. The left ventricle demonstrates global hypokinesis. The left ventricular internal cavity size was mildly dilated. Left ventricular diastolic parameters are consistent with Grade II diastolic dysfunction (pseudonormalization). Elevated left atrial pressure.  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  2. Right ventricular systolic function is normal. The right ventricular size is normal. There is moderately elevated pulmonary artery systolic pressure.  3. Left atrial size was severely dilated.  4. Right atrial size was mildly dilated.  5. A small pericardial effusion is present. The pericardial effusion is circumferential. There is no evidence of cardiac tamponade.  6. The mitral valve is normal in structure. Trivial mitral valve regurgitation.  7. Tricuspid valve regurgitation is moderate.  8. The aortic valve is tricuspid. Aortic valve regurgitation is not  visualized. No aortic stenosis is present.  9. The inferior vena cava is dilated in size with >50% respiratory variability, suggesting right atrial pressure of 8 mmHg. FINDINGS  Left Ventricle: No left ventricular thrombus is seen (definity contrast used). Left ventricular ejection fraction, by estimation, is 20 to 25%. The left ventricle has severely decreased function. The left ventricle demonstrates global hypokinesis. The left ventricular internal cavity size was mildly dilated. There is no left ventricular hypertrophy. Left ventricular diastolic parameters are consistent with Grade II diastolic dysfunction (pseudonormalization). Elevated left atrial pressure.  LV Wall Scoring: The inferior septum, entire inferior wall, basal anteroseptal segment, apical anterior segment, and apex are akinetic. The anterior wall, entire lateral wall, and mid anteroseptal segment are hypokinetic. 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 Right  Ventricle: The right ventricular size is normal. No increase in right ventricular wall thickness. Right ventricular systolic function is normal. There is moderately elevated pulmonary artery systolic pressure. The tricuspid regurgitant velocity is 3.27 m/s, and with an assumed right atrial pressure of 8 mmHg, the estimated right ventricular systolic pressure is 39.7 mmHg. Left Atrium: Left atrial size was severely dilated. Right Atrium: Right atrial size was mildly dilated. Pericardium: A small pericardial effusion is present. The pericardial effusion is circumferential. There is no evidence of cardiac tamponade. Mitral Valve: The mitral valve is normal in structure. Trivial mitral valve regurgitation. Tricuspid Valve: The tricuspid valve is normal in structure. Tricuspid valve regurgitation is moderate. Aortic Valve: The aortic valve is tricuspid. Aortic valve regurgitation is not visualized. No aortic stenosis is present. Aortic valve peak gradient measures 7.1 mmHg. Pulmonic Valve: The pulmonic valve was grossly normal. Pulmonic valve regurgitation is not visualized. Aorta: The aortic root and ascending aorta are structurally normal, with no evidence of dilitation. Venous: The inferior vena cava is dilated in size with greater than 50% respiratory variability, suggesting right atrial pressure of 8 mmHg. IAS/Shunts: No atrial level shunt detected by color flow Doppler.  LEFT VENTRICLE PLAX 2D LVIDd:         5.90 cm      Diastology LVIDs:         5.00 cm      LV e' medial:    9.64 cm/s LV PW:         1.00 cm      LV E/e' medial:  9.2 LV IVS:        0.70 cm      LV e' lateral:   11.20 cm/s LVOT diam:     2.00 cm      LV E/e' lateral: 7.9 LV SV:         46 LV SV Index:   23 LVOT Area:     3.14 cm  LV Volumes (MOD) LV vol d, MOD A2C: 237.0 ml LV vol d, MOD A4C: 198.0 ml LV vol s, MOD A2C: 174.0 ml LV vol s, MOD A4C: 146.0 ml LV SV MOD A2C:     63.0 ml LV SV MOD A4C:     198.0 ml LV SV MOD BP:      59.3 ml RIGHT  VENTRICLE             IVC RV Basal diam:  3.60 cm     IVC diam: 2.20 cm RV Mid diam:    2.70 cm RV  S prime:     13.40 cm/s TAPSE (M-mode): 1.9 cm LEFT ATRIUM             Index        RIGHT ATRIUM           Index LA diam:        3.90 cm 1.94 cm/m   RA Area:     18.00 cm LA Vol (A2C):   59.3 ml 29.55 ml/m  RA Volume:   48.90 ml  24.37 ml/m LA Vol (A4C):   65.2 ml 32.49 ml/m LA Biplane Vol: 66.7 ml 33.24 ml/m  AORTIC VALVE AV Area (Vmax): 2.26 cm AV Vmax:        133.00 cm/s AV Peak Grad:   7.1 mmHg LVOT Vmax:      95.80 cm/s LVOT Vmean:     61.900 cm/s LVOT VTI:       0.146 m  AORTA Ao Root diam: 3.60 cm Ao Asc diam:  2.80 cm MITRAL VALVE               TRICUSPID VALVE MV Area (PHT): 4.99 cm    TR Peak grad:   42.8 mmHg MV Decel Time: 152 msec    TR Vmax:        327.00 cm/s MV E velocity: 88.60 cm/s MV A velocity: 64.50 cm/s  SHUNTS MV E/A ratio:  1.37        Systemic VTI:  0.15 m                            Systemic Diam: 2.00 cm Dani Gobble Croitoru MD Electronically signed by Sanda Klein MD Signature Date/Time: 12/24/2021/12:42:44 PM    Final    CT Angio Chest/Abd/Pel for Dissection W and/or W/WO  Result Date: 12/23/2021 CLINICAL DATA:  Aortic dissection, clinical status change EXAM: CT ANGIOGRAPHY CHEST, ABDOMEN AND PELVIS TECHNIQUE: Non-contrast CT of the chest was initially obtained. Multidetector CT imaging through the chest, abdomen and pelvis was performed using the standard protocol during bolus administration of intravenous contrast. Multiplanar reconstructed images and MIPs were obtained and reviewed to evaluate the vascular anatomy. RADIATION DOSE REDUCTION: This exam was performed according to the departmental dose-optimization program which includes automated exposure control, adjustment of the mA and/or kV according to patient size and/or use of iterative reconstruction technique. CONTRAST:  129mL OMNIPAQUE IOHEXOL 350 MG/ML SOLN COMPARISON:  None. FINDINGS: CTA CHEST FINDINGS Cardiovascular:  Normal cardiac size. Trace pericardial fluid. Normal size main and branch pulmonary arteries. No evidence of pulmonary embolism. Motion artifact in the ascending aorta. There is moderate calcified atherosclerosis of the aortic arch. 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 This is not in association with an aberrant subclavian artery. No evidence of thoracic aortic aneurysm, dissection, or intramural hematoma. There are multiple prominent venous collateral vessels in the chest primarily on the right, suggestive of possible right brachiocephalic/subclavian vein narrowing, possibly as it passes under the clavicle. Mediastinum/Nodes: There are multiple enlarged mediastinal and hilar lymph nodes. For reference, a prevascular lymph node measures 1.3 cm (series 7, image 45). Left paratracheal lymph node measures 1.1 cm (series 7, image 48). Lungs/Pleura: Moderate right and small left pleural effusions with adjacent atelectasis. Diffuse bronchial wall thickening, severe in the lower lungs. Diffuse interlobular septal thickening and ground-glass. Moderate centrilobular and paraseptal emphysema. Biapical pleuroparenchymal scarring. No visible pulmonary nodule.  Musculoskeletal: No acute osseous abnormality. No suspicious lytic or blastic lesion. There is an age-indeterminate anterior compression deformity of T8 with a proximally 20% height loss. Review of the MIP images confirms the above findings. CTA ABDOMEN AND PELVIS FINDINGS VASCULAR Aorta: There is an infrarenal abdominal aortic aneurysm with lobular mural thrombus. This measures at maximum 3.8 x 3.7 cm (series 7, image 141). The mural thrombus results in mild-to-moderate focal stenosis. There is hyperdense thickening of the aortic wall (series 7, image 140). There is possible mild adjacent stranding. Celiac: Patent without stenosis. SMA: Patent without significant  stenosis. Renals: Patent without significant stenosis. There are 2 small left renal arteries. IMA: Patent with mild-to-moderate stenosis at the IMA ostia which originates at the level of the aortic aneurysm. Inflow: Patent without significant stenosis. Veins: No obvious venous abnormality within the limitations of this arterial phase study. Review of the MIP images confirms the above findings. NON-VASCULAR Hepatobiliary: No focal liver abnormality is seen. No gallstones, gallbladder wall thickening, or biliary dilatation. Pancreas: Unremarkable. No pancreatic ductal dilatation or surrounding inflammatory changes. Spleen: Normal in size without focal abnormality. Adrenals/Urinary Tract: There is bilateral adrenal gland thickening, left worse than right. Probable 1.4 cm left adrenal adenoma (coronal image 90). No hydronephrosis or nephrolithiasis. The bladder is well distended but otherwise unremarkable. Stomach/Bowel: The stomach is within normal limits. There is no evidence of bowel obstruction.The appendix is normal. There is nonspecific mesenteric haziness. Lymphatic: There are multiple enlarged retroperitoneal lymph nodes. For reference: Left periaortic lymph nodes measures 1.2 cm and 1.0 cm (series 7, images 131 and 132). Reproductive: Mildly enlarged prostate. Other: There is a small fat containing left inguinal hernia. Musculoskeletal: No acute or significant osseous findings. Review of the MIP images confirms the above findings. IMPRESSION: Infrarenal abdominal aortic aneurysm measuring up to 3.8 x 3.7 cm, with significant mural thrombus. There is hyperdense aortic wall thickening, concerning for a high attenuation crescent sign, which is a sign of impending AAA rupture, although rupture would be atypical at this size of aneurysm. There is adjacent mild periaortic stranding and retroperitoneal adenopathy, suggesting this could alternatively represent aortitis. Recommend vascular surgery consultation and  correlation with inflammatory markers. Correlation with prior imaging, if available, would be useful. Moderate pulmonary edema with moderate right and small left pleural effusions and adjacent basilar atelectasis. Prominent mediastinal and hilar lymph nodes, which are favored to be reactive. Attention on follow-up exam. Age-indeterminate anterior compression deformity of T8 with approximately 20% height loss. These results were called by telephone at the time of interpretation on 12/23/2021 at 12:54 pm to provider [facility] , who verbally acknowledged these results. Electronically Signed   By: Maurine Simmering M.D.   On: 12/23/2021 12:58    Scheduled Meds:    atorvastatin  80 mg Oral Daily   dapagliflozin propanediol  10 mg Oral Daily   ezetimibe  10 mg Oral Daily   furosemide  40 mg Intravenous Q12H   guaiFENesin  600 mg Oral BID   insulin aspart  0-9 Units Subcutaneous TID WC   sodium chloride flush  3 mL Intravenous Q12H   spironolactone  12.5 mg Oral Daily    Continuous Infusions:    pantoprazole 8 mg/hr (12/24/21 0559)     LOS: 0 days     Vernell Leep, MD,  FACP, [facility], [facility], [facility] (Care Management Physician Certified) [place]  To contact the attending provider between 7A-7P or the covering provider during after hours 7P-7A, please  log into the web site [email or web address] and access using universal [place] password for that web site. If you do not have the password, please call the hospital operator.  12/24/2021, 3:59 PM

## 2021-12-25 ENCOUNTER — Inpatient Hospital Stay (HOSPITAL_COMMUNITY): Payer: Medicare Other

## 2021-12-25 DIAGNOSIS — R778 Other specified abnormalities of plasma proteins: Secondary | ICD-10-CM

## 2021-12-25 DIAGNOSIS — I5023 Acute on chronic systolic (congestive) heart failure: Secondary | ICD-10-CM | POA: Diagnosis not present

## 2021-12-25 DIAGNOSIS — J9601 Acute respiratory failure with hypoxia: Secondary | ICD-10-CM

## 2021-12-25 LAB — CBC
HCT: 32.3 % — ABNORMAL LOW (ref 39.0–52.0)
Hemoglobin: 10.1 g/dL — ABNORMAL LOW (ref 13.0–17.0)
MCH: 28.4 pg (ref 26.0–34.0)
MCHC: 31.3 g/dL (ref 30.0–36.0)
MCV: 90.7 fL (ref 80.0–100.0)
Platelets: 190 10*3/uL (ref 150–400)
RBC: 3.56 MIL/uL — ABNORMAL LOW (ref 4.22–5.81)
RDW: 15.7 % — ABNORMAL HIGH (ref 11.5–15.5)
WBC: 13.1 10*3/uL — ABNORMAL HIGH (ref 4.0–10.5)
nRBC: 0 % (ref 0.0–0.2)

## 2021-12-25 LAB — COMPREHENSIVE METABOLIC PANEL
ALT: 9 U/L (ref 0–44)
AST: 12 U/L — ABNORMAL LOW (ref 15–41)
Albumin: 2.6 g/dL — ABNORMAL LOW (ref 3.5–5.0)
Alkaline Phosphatase: 62 U/L (ref 38–126)
Anion gap: 13 (ref 5–15)
BUN: 21 mg/dL (ref 8–23)
CO2: 22 mmol/L (ref 22–32)
Calcium: 8.3 mg/dL — ABNORMAL LOW (ref 8.9–10.3)
Chloride: 107 mmol/L (ref 98–111)
Creatinine, Ser: 1.47 mg/dL — ABNORMAL HIGH (ref 0.61–1.24)
GFR, Estimated: 51 mL/min — ABNORMAL LOW (ref >60)
Glucose, Bld: 135 mg/dL — ABNORMAL HIGH (ref 70–99)
Potassium: 3.5 mmol/L (ref 3.5–5.1)
Sodium: 142 mmol/L (ref 135–145)
Total Bilirubin: 0.6 mg/dL (ref 0.3–1.2)
Total Protein: 6.2 g/dL — ABNORMAL LOW (ref 6.5–8.1)

## 2021-12-25 LAB — GLUCOSE, CAPILLARY
Glucose-Capillary: 127 mg/dL — ABNORMAL HIGH (ref 70–99)
Glucose-Capillary: 145 mg/dL — ABNORMAL HIGH (ref 70–99)
Glucose-Capillary: 123 mg/dL — ABNORMAL HIGH (ref 70–99)
Glucose-Capillary: 170 mg/dL — ABNORMAL HIGH (ref 70–99)

## 2021-12-25 MED ORDER — PANTOPRAZOLE SODIUM 40 MG PO TBEC
40.0000 mg | DELAYED_RELEASE_TABLET | Freq: Every day | ORAL | Status: DC
  Administered 2021-12-26 – 2022-01-01 (×7): 40 mg via ORAL
  Filled 2021-12-25 (×7): qty 1

## 2021-12-25 MED ORDER — POTASSIUM CHLORIDE CRYS ER 20 MEQ PO TBCR
40.0000 meq | EXTENDED_RELEASE_TABLET | Freq: Once | ORAL | Status: AC
  Administered 2021-12-25: 40 meq via ORAL
  Filled 2021-12-25: qty 2

## 2021-12-25 NOTE — Progress Notes (Addendum)
°  Progress Note    12/25/2021 8:15 AM * No surgery date entered *  Subjective:  Denies abd or back pain   Vitals:   12/25/21 0547 12/25/21 0745  BP: 112/65 110/70  Pulse: 92 85  Resp: 20 20  Temp: 97.6 F (36.4 C) 97.8 F (36.6 C)  SpO2: 95% 97%   Physical Exam: Lungs:  non labored Extremities:  moving all extremities well Abdomen:  soft, NT, ND Neurologic: A&O  CBC    Component Value Date/Time   WBC 13.1 (H) 12/25/2021 0205   RBC 3.56 (L) 12/25/2021 0205   HGB 10.1 (L) 12/25/2021 0205   HCT 32.3 (L) 12/25/2021 0205   PLT 190 12/25/2021 0205   MCV 90.7 12/25/2021 0205   MCH 28.4 12/25/2021 0205   MCHC 31.3 12/25/2021 0205   RDW 15.7 (H) 12/25/2021 0205    BMET    Component Value Date/Time   NA 142 12/25/2021 0205   K 3.5 12/25/2021 0205   CL 107 12/25/2021 0205   CO2 22 12/25/2021 0205   GLUCOSE 135 (H) 12/25/2021 0205   BUN 21 12/25/2021 0205   CREATININE 1.47 (H) 12/25/2021 0205   CALCIUM 8.3 (L) 12/25/2021 0205   GFRNONAA 51 (L) 12/25/2021 0205    INR No results found for: INR   Intake/Output Summary (Last 24 hours) at 12/25/2021 0815 Last data filed at 12/25/2021 0549 Gross per 24 hour  Intake 1194.67 ml  Output 1700 ml  Net -505.33 ml     Assessment/Plan:  71 y.o. male with 3.8cm AAA asymptomatic  No indication for repair of AAA currently [facility] for discharge from vascular surgery standpoint Office will arrange AAA duplex in 6 months   Dagoberto Ligas, PA-C [facility] [phone] 12/25/2021 8:15 AM  VASCULAR STAFF ADDENDUM: I have independently interviewed and examined the patient. I agree with the above.   Yevonne Aline. Stanford Breed, MD [facility] Phone Number: [phone] 12/25/2021 8:25 AM

## 2021-12-25 NOTE — Evaluation (Signed)
Physical Therapy Evaluation Patient Details Name: Bob Johnson MRN: [id number] DOB: November 18, 1950 Today's Date: 12/25/2021  History of Present Illness  Patient is a 71 y/o male who presents on 12/23/21 from PCP office due to abnormal labs. Found to have GI bleed, symptomatic anemia, SOB and acute on chronic heart failure. PMH includes CAD, DM, HTN, PVD, Ischemic cardiomyopathy, CHF  Clinical Impression  Patient presents with dyspnea on exertion, decreased activity tolerance, impaired balance and impaired mobility s/p above. Pt lives at home with wife and is independent for ADLs and walking at baseline. Pt has limited activity tolerance at baseline due to SOB so only walks short distances. Today, pt requires Min A for transfers and gait training due to LOB and unsteadiness, requiring  IV pole for support. Not able to get accurate SP02 reading due to poor wave form but 2/4 DOE noted with mobility. May benefit from rollator to improve endurance and activity. Will trial next session. Encouraged OOB to chair and walking to bathroom as needed with nursing. Recommend mobility tech. Will follow acutely to maximize independence and mobility prior to return home.     Recommendations for follow up therapy are one component of a multi-disciplinary discharge planning process, led by the attending physician.  Recommendations may be updated based on patient status, additional functional criteria and insurance authorization.  Follow Up Recommendations No PT follow up (pending improvement)    Assistance Recommended at Discharge Intermittent Supervision/Assistance  Patient can return home with the following  Assistance with cooking/housework;Help with stairs or ramp for entrance;A little help with walking and/or transfers    Equipment Recommendations Rollator (4 wheels) (pending trial and improvement)  Recommendations for Other Services       Functional Status Assessment Patient has had a recent decline in their  functional status and demonstrates the ability to make significant improvements in function in a reasonable and predictable amount of time.     Precautions / Restrictions Precautions Precautions: Fall;Other (comment) Precaution Comments: watch 02 Restrictions Weight Bearing Restrictions: No      Mobility  Bed Mobility Overal bed mobility: Modified Independent             General bed mobility comments: Use of rails, no physical assist.    Transfers Overall transfer level: Needs assistance Equipment used: None Transfers: Sit to/from Stand Sit to Stand: Min assist           General transfer comment: LOB posteriorly when coming to standing, Min A needed. Stood from [employer].    Ambulation/Gait Ambulation/Gait assistance: Min Web designer (Feet): 40 Feet Assistive device: IV Pole Gait Pattern/deviations: Step-through pattern, Decreased stride length Gait velocity: decreased     General Gait Details: SLow, unsteady gait holding onto IV pole for support, Min A needed for balance deficits. 2-3/4 DOE. Difficult to get accurate 02 reading due to poor wave form. Pt on 3L/min 02 [place].  Stairs            Wheelchair Mobility    Modified Rankin (Stroke Patients Only)       Balance Overall balance assessment: Needs assistance Sitting-balance support: Feet supported, No upper extremity supported Sitting balance-Leahy Scale: Good     Standing balance support: During functional activity Standing balance-Leahy Scale: Fair Standing balance comment: Able to stand statically without UE support but needs UE support for dynamic tasks/walking  Pertinent Vitals/Pain Pain Assessment Pain Assessment: No/denies pain    Home Living Family/patient expects to be discharged to:: Private residence Living Arrangements: Spouse/significant other Available Help at Discharge: Family;Available 24 hours/day Type of Home: Mobile  home Home Access: Stairs to enter Entrance Stairs-Rails: Right;Left;Can reach both Entrance Stairs-Number of Steps: 4   Home Layout: One level Home Equipment: None      Prior Function Prior Level of Function : Independent/Modified Independent             Mobility Comments: INdependent, drives. Does not wear 02, limited activity tolerance at baseline, no falls. ADLs Comments: independent     Hand Dominance   Dominant Hand: Right    Extremity/Trunk Assessment   Upper Extremity Assessment Upper Extremity Assessment: Defer to OT evaluation    Lower Extremity Assessment Lower Extremity Assessment: Generalized weakness    Cervical / Trunk Assessment Cervical / Trunk Assessment: Normal  Communication   Communication: No difficulties  Cognition Arousal/Alertness: Awake/alert Behavior During Therapy: WFL for tasks assessed/performed Overall Cognitive Status: Within Functional Limits for tasks assessed                                          General Comments General comments (skin integrity, edema, etc.): Wife present during session.    Exercises     Assessment/Plan    PT Assessment Patient needs continued PT services  PT Problem List Decreased mobility;Decreased balance;Cardiopulmonary status limiting activity;Decreased activity tolerance       PT Treatment Interventions Therapeutic exercise;Gait training;DME instruction;Balance training;Stair training;Functional mobility training;Therapeutic activities;Patient/family education    PT Goals (Current goals can be found in the Care Plan section)  Acute Rehab PT Goals Patient Stated Goal: to go home PT Goal Formulation: With patient Time For Goal Achievement: 01/08/22 Potential to Achieve Goals: Good    Frequency Min 3X/week     Co-evaluation               AM-PAC PT "6 Clicks" Mobility  Outcome Measure Help needed turning from your back to your side while in a flat bed without using  bedrails?: None Help needed moving from lying on your back to sitting on the side of a flat bed without using bedrails?: None Help needed moving to and from a bed to a chair (including a wheelchair)?: A Little Help needed standing up from a chair using your arms (e.g., wheelchair or bedside chair)?: A Little Help needed to walk in hospital room?: A Little Help needed climbing 3-5 steps with a railing? : A Little 6 Click Score: 20    End of Session Equipment Utilized During Treatment: Gait belt Activity Tolerance: Patient limited by fatigue;Other (comment) (SOB) Patient left: in bed;with call bell/phone within reach;with family/visitor present Nurse Communication: Mobility status PT Visit Diagnosis: Other (comment);Unsteadiness on feet (R26.81);Other abnormalities of gait and mobility (R26.89) (DOE)    Time: 0569-7948 PT Time Calculation (min) (ACUTE ONLY): 26 min   Charges:   PT Evaluation $PT Eval Moderate Complexity: 1 Mod PT Treatments $Gait Training: 8-22 mins        Marisa Severin, PT, DPT Acute Rehabilitation Services Pager [phone] Office Wall 12/25/2021, 3:23 PM

## 2021-12-25 NOTE — Progress Notes (Signed)
Progress Note  Patient Name: Gurney Balthazor Date of Encounter: 12/25/2021  [facility] Cardiologist: None   Subjective   Feeling better after IV diuresis but still feels like he has extra fluid on board.  Inpatient Medications    Scheduled Meds:  atorvastatin  80 mg Oral Daily   dapagliflozin propanediol  10 mg Oral Daily   ezetimibe  10 mg Oral Daily   furosemide  40 mg Intravenous Q12H   guaiFENesin  600 mg Oral BID   insulin aspart  0-9 Units Subcutaneous TID WC   sodium chloride flush  3 mL Intravenous Q12H   spironolactone  12.5 mg Oral Daily   Continuous Infusions:  pantoprazole 8 mg/hr (12/25/21 1114)   PRN Meds: acetaminophen **OR** acetaminophen, ipratropium-albuterol   Vital Signs    Vitals:   12/24/21 2228 12/25/21 0000 12/25/21 0547 12/25/21 0745  BP:  118/67 112/65 110/70  Pulse:  97 92 85  Resp: (!) 25 20 20 20   Temp:  (!) 97.4 F (36.3 C) 97.6 F (36.4 C) 97.8 F (36.6 C)  TempSrc:  Axillary Axillary Axillary  SpO2: 94% 92% 95% 97%  Weight:   76.5 kg   Height:        Intake/Output Summary (Last 24 hours) at 12/25/2021 1124 Last data filed at 12/25/2021 0549 Gross per 24 hour  Intake 1144.67 ml  Output 1700 ml  Net -555.33 ml   Last 3 Weights 12/25/2021 12/23/2021 12/22/2021  Weight (lbs) 168 lb 10.4 oz 169 lb 12.1 oz 168 lb  Weight (kg) 76.5 kg 77 kg 76.204 kg      Telemetry    Personally Reviewed  ECG    Personally Reviewed  Physical Exam   GEN: No acute distress.  On O2 via [place]. Neck: No JVD. Mild iWOB. Cardiac: RRR, no murmurs, rubs, or gallops.  Respiratory: Decreased air movement. GI: Soft, nontender, non-distended  MS: No edema; No deformity. Neuro:  Nonfocal  Psych: Normal affect   Labs    High Sensitivity Troponin:   Recent Labs  Lab 12/24/21 1300 12/24/21 1452  TROPONINIHS 270* 302*     Chemistry Recent Labs  Lab 12/23/21 0530 12/24/21 0645 12/25/21 0205  NA 142 144 142  K 3.4* 4.1 3.5  CL 107 110 107   CO2 23 21* 22  GLUCOSE 107* 108* 135*  BUN 26* 20 21  CREATININE 1.24 1.44* 1.47*  CALCIUM 8.6* 8.4* 8.3*  PROT 6.7  --  6.2*  ALBUMIN 3.0*  --  2.6*  AST 15  --  12*  ALT 9  --  9  ALKPHOS 58  --  62  BILITOT 0.4  --  0.6  GFRNONAA >60 52* 51*  ANIONGAP 12 13 13     Lipids No results for input(s): CHOL, TRIG, HDL, LABVLDL, LDLCALC, CHOLHDL in the last 168 hours.  Hematology Recent Labs  Lab 12/23/21 0530 12/24/21 0645 12/25/21 0205  WBC 10.1 11.9* 13.1*  RBC 2.95* 3.47* 3.56*  HGB 8.4* 9.9* 10.1*  HCT 26.5* 31.7* 32.3*  MCV 89.8 91.4 90.7  MCH 28.5 28.5 28.4  MCHC 31.7 31.2 31.3  RDW 16.4* 16.1* 15.7*  PLT 206 204 190   Thyroid No results for input(s): TSH, FREET4 in the last 168 hours.  BNPNo results for input(s): BNP, PROBNP in the last 168 hours.  DDimer No results for input(s): DDIMER in the last 168 hours.   Radiology    DG Chest 2 View  Result Date: 12/25/2021 CLINICAL DATA:  Acute congestive heart failure. EXAM: CHEST - 2 VIEW COMPARISON:  12/23/2021. FINDINGS: Since the prior study there has been no convincing change allowing for differences in patient positioning and technique. There are persistent opacities that extend from the perihilar regions to the lung bases, obscuring hemidiaphragms. Lung opacities are associated with moderate bilateral pleural effusions. There is also bilateral vascular congestion and interstitial thickening. No new lung abnormalities.  No pneumothorax. IMPRESSION: 1. No significant change from the exam is dated 12/23/2021. 2. Persistent lung opacities including vascular congestion and interstitial thickening, consistent with combination interstitial pulmonary edema, lung base atelectasis and moderate effusions. Electronically Signed   By: Lajean Manes M.D.   On: 12/25/2021 08:42   DG CHEST PORT 1 VIEW  Result Date: 12/23/2021 CLINICAL DATA:  Tachypnea EXAM: PORTABLE CHEST 1 VIEW COMPARISON:  CT 12/23/2021 FINDINGS: Cardiomegaly with  vascular congestion and diffuse interstitial and ground-glass opacities suspicious for pulmonary edema. Small moderate bilateral effusions. Airspace disease at the bases. No pneumothorax. IMPRESSION: 1. Cardiomegaly with vascular congestion and diffuse interstitial and ground-glass opacity suspicious for pulmonary edema. Small-moderate bilateral effusions with basilar airspace disease Electronically Signed   By: Donavan Foil M.D.   On: 12/23/2021 17:02   ECHOCARDIOGRAM COMPLETE  Result Date: 12/24/2021    ECHOCARDIOGRAM REPORT   Patient Name:   RISHAV ROCKEFELLER Date of Exam: 12/24/2021 Medical Rec #:  [id number]    Height:       73.0 in Accession #:    [id number]   Weight:       169.8 lb Date of Birth:  1951/07/07    BSA:          2.007 m Patient Age:    71 years     BP:           108/69 mmHg Patient Gender: M            HR:           101 bpm. Exam Location:  Inpatient Procedure: 2D Echo, Cardiac Doppler, Color Doppler and Intracardiac            Opacification Agent Indications:    CHF  History:        Patient has no prior history of Echocardiogram examinations.                 CAD; Risk Factors:Hypertension and Diabetes.  Sonographer:    Jyl Heinz Referring Phys: [place]  1. Left ventricular ejection fraction, by estimation, is 20 to 25%. The left ventricle has severely decreased function. The left ventricle demonstrates global hypokinesis. The left ventricular internal cavity size was mildly dilated. Left ventricular diastolic parameters are consistent with Grade II diastolic dysfunction (pseudonormalization). Elevated left atrial pressure. 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  2. Right ventricular systolic function is normal. The right ventricular size is normal. There is moderately  elevated pulmonary artery systolic pressure.  3. Left atrial size was severely dilated.  4. Right atrial size was mildly dilated.  5. A small pericardial effusion is present. The pericardial effusion is circumferential. There is no evidence of cardiac tamponade.  6. The mitral valve is normal in structure. Trivial mitral valve regurgitation.  7. Tricuspid valve regurgitation is moderate.  8. The aortic valve is tricuspid. Aortic valve regurgitation is not visualized. No aortic stenosis  is present.  9. The inferior vena cava is dilated in size with >50% respiratory variability, suggesting right atrial pressure of 8 mmHg. FINDINGS  Left Ventricle: No left ventricular thrombus is seen (definity contrast used). Left ventricular ejection fraction, by estimation, is 20 to 25%. The left ventricle has severely decreased function. The left ventricle demonstrates global hypokinesis. The left ventricular internal cavity size was mildly dilated. There is no left ventricular hypertrophy. Left ventricular diastolic parameters are consistent with Grade II diastolic dysfunction (pseudonormalization). Elevated left atrial pressure.  LV Wall Scoring: The inferior septum, entire inferior wall, basal anteroseptal segment, apical anterior segment, and apex are akinetic. The anterior wall, entire lateral wall, and mid anteroseptal segment are hypokinetic. 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 Right Ventricle: The right ventricular size is normal. No increase in right ventricular wall thickness. Right ventricular systolic function is normal. There is moderately elevated pulmonary artery systolic pressure. The tricuspid regurgitant velocity is 3.27 m/s, and with an assumed right atrial pressure of 8 mmHg, the estimated right ventricular systolic  pressure is 59.9 mmHg. Left Atrium: Left atrial size was severely dilated. Right Atrium: Right atrial size was mildly dilated. Pericardium: A small pericardial effusion is present. The pericardial effusion is circumferential. There is no evidence of cardiac tamponade. Mitral Valve: The mitral valve is normal in structure. Trivial mitral valve regurgitation. Tricuspid Valve: The tricuspid valve is normal in structure. Tricuspid valve regurgitation is moderate. Aortic Valve: The aortic valve is tricuspid. Aortic valve regurgitation is not visualized. No aortic stenosis is present. Aortic valve peak gradient measures 7.1 mmHg. Pulmonic Valve: The pulmonic valve was grossly normal. Pulmonic valve regurgitation is not visualized. Aorta: The aortic root and ascending aorta are structurally normal, with no evidence of dilitation. Venous: The inferior vena cava is dilated in size with greater than 50% respiratory variability, suggesting right atrial pressure of 8 mmHg. IAS/Shunts: No atrial level shunt detected by color flow Doppler.  LEFT VENTRICLE PLAX 2D LVIDd:         5.90 cm      Diastology LVIDs:         5.00 cm      LV e' medial:    9.64 cm/s LV PW:         1.00 cm      LV E/e' medial:  9.2 LV IVS:        0.70 cm      LV e' lateral:   11.20 cm/s LVOT diam:     2.00 cm      LV E/e' lateral: 7.9 LV SV:         46 LV SV Index:   23 LVOT Area:     3.14 cm  LV Volumes (MOD) LV vol d, MOD A2C: 237.0 ml LV vol d, MOD A4C: 198.0 ml LV vol s, MOD A2C: 174.0 ml LV vol s, MOD A4C: 146.0 ml LV SV MOD A2C:     63.0 ml LV SV MOD A4C:     198.0 ml LV SV MOD BP:      59.3 ml RIGHT VENTRICLE             IVC RV Basal diam:  3.60 cm     IVC diam: 2.20 cm RV Mid diam:    2.70 cm RV S prime:  13.40 cm/s TAPSE (M-mode): 1.9 cm LEFT ATRIUM             Index        RIGHT ATRIUM           Index LA diam:        3.90 cm 1.94 cm/m   RA Area:     18.00 cm LA Vol (A2C):   59.3 ml 29.55 ml/m  RA Volume:   48.90 ml  24.37 ml/m LA Vol (A4C):    65.2 ml 32.49 ml/m LA Biplane Vol: 66.7 ml 33.24 ml/m  AORTIC VALVE AV Area (Vmax): 2.26 cm AV Vmax:        133.00 cm/s AV Peak Grad:   7.1 mmHg LVOT Vmax:      95.80 cm/s LVOT Vmean:     61.900 cm/s LVOT VTI:       0.146 m  AORTA Ao Root diam: 3.60 cm Ao Asc diam:  2.80 cm MITRAL VALVE               TRICUSPID VALVE MV Area (PHT): 4.99 cm    TR Peak grad:   42.8 mmHg MV Decel Time: 152 msec    TR Vmax:        327.00 cm/s MV E velocity: 88.60 cm/s MV A velocity: 64.50 cm/s  SHUNTS MV E/A ratio:  1.37        Systemic VTI:  0.15 m                            Systemic Diam: 2.00 cm Dani Gobble Croitoru MD Electronically signed by Sanda Klein MD Signature Date/Time: 12/24/2021/12:42:44 PM    Final    CT Angio Chest/Abd/Pel for Dissection W and/or W/WO  Result Date: 12/23/2021 CLINICAL DATA:  Aortic dissection, clinical status change EXAM: CT ANGIOGRAPHY CHEST, ABDOMEN AND PELVIS TECHNIQUE: Non-contrast CT of the chest was initially obtained. Multidetector CT imaging through the chest, abdomen and pelvis was performed using the standard protocol during bolus administration of intravenous contrast. Multiplanar reconstructed images and MIPs were obtained and reviewed to evaluate the vascular anatomy. RADIATION DOSE REDUCTION: This exam was performed according to the departmental dose-optimization program which includes automated exposure control, adjustment of the mA and/or kV according to patient size and/or use of iterative reconstruction technique. CONTRAST:  165mL OMNIPAQUE IOHEXOL 350 MG/ML SOLN COMPARISON:  None. FINDINGS: CTA CHEST FINDINGS Cardiovascular: Normal cardiac size. Trace pericardial fluid. Normal size main and branch pulmonary arteries. No evidence of pulmonary embolism. Motion artifact in the ascending aorta. There is moderate calcified atherosclerosis of the aortic arch. There is a diverticulum of [place] with narrow vessel coursing posterior to the esophagus and appears to connect with an  intercostal artery. This is not in association with an aberrant subclavian artery. No evidence of thoracic aortic aneurysm, dissection, or intramural hematoma. There are multiple prominent venous collateral vessels in the chest primarily on the right, suggestive of possible right brachiocephalic/subclavian vein narrowing, possibly as it passes under the clavicle. Mediastinum/Nodes: There are multiple enlarged mediastinal and hilar lymph nodes. For reference, a prevascular lymph node measures 1.3 cm (series 7, image 45). Left paratracheal lymph node measures 1.1 cm (series 7, image 48). Lungs/Pleura: Moderate right and small left pleural effusions with adjacent atelectasis. Diffuse bronchial wall thickening, severe in the lower lungs. Diffuse interlobular septal thickening and ground-glass. Moderate centrilobular and paraseptal emphysema. Biapical pleuroparenchymal scarring. No visible pulmonary nodule. Musculoskeletal: No acute osseous abnormality.  No suspicious lytic or blastic lesion. There is an age-indeterminate anterior compression deformity of T8 with a proximally 20% height loss. Review of the MIP images confirms the above findings. CTA ABDOMEN AND PELVIS FINDINGS VASCULAR Aorta: There is an infrarenal abdominal aortic aneurysm with lobular mural thrombus. This measures at maximum 3.8 x 3.7 cm (series 7, image 141). The mural thrombus results in mild-to-moderate focal stenosis. There is hyperdense thickening of the aortic wall (series 7, image 140). There is possible mild adjacent stranding. Celiac: Patent without stenosis. SMA: Patent without significant stenosis. Renals: Patent without significant stenosis. There are 2 small left renal arteries. IMA: Patent with mild-to-moderate stenosis at the IMA ostia which originates at the level of the aortic aneurysm. Inflow: Patent without significant stenosis. Veins: No obvious venous abnormality within the limitations of this arterial phase study. Review of the MIP  images confirms the above findings. NON-VASCULAR Hepatobiliary: No focal liver abnormality is seen. No gallstones, gallbladder wall thickening, or biliary dilatation. Pancreas: Unremarkable. No pancreatic ductal dilatation or surrounding inflammatory changes. Spleen: Normal in size without focal abnormality. Adrenals/Urinary Tract: There is bilateral adrenal gland thickening, left worse than right. Probable 1.4 cm left adrenal adenoma (coronal image 90). No hydronephrosis or nephrolithiasis. The bladder is well distended but otherwise unremarkable. Stomach/Bowel: The stomach is within normal limits. There is no evidence of bowel obstruction.The appendix is normal. There is nonspecific mesenteric haziness. Lymphatic: There are multiple enlarged retroperitoneal lymph nodes. For reference: Left periaortic lymph nodes measures 1.2 cm and 1.0 cm (series 7, images 131 and 132). Reproductive: Mildly enlarged prostate. Other: There is a small fat containing left inguinal hernia. Musculoskeletal: No acute or significant osseous findings. Review of the MIP images confirms the above findings. IMPRESSION: Infrarenal abdominal aortic aneurysm measuring up to 3.8 x 3.7 cm, with significant mural thrombus. There is hyperdense aortic wall thickening, concerning for a high attenuation crescent sign, which is a sign of impending AAA rupture, although rupture would be atypical at this size of aneurysm. There is adjacent mild periaortic stranding and retroperitoneal adenopathy, suggesting this could alternatively represent aortitis. Recommend vascular surgery consultation and correlation with inflammatory markers. Correlation with prior imaging, if available, would be useful. Moderate pulmonary edema with moderate right and small left pleural effusions and adjacent basilar atelectasis. Prominent mediastinal and hilar lymph nodes, which are favored to be reactive. Attention on follow-up exam. Age-indeterminate anterior compression  deformity of T8 with approximately 20% height loss. These results were called by telephone at the time of interpretation on 12/23/2021 at 12:54 pm to provider [facility] , who verbally acknowledged these results. Electronically Signed   By: Maurine Simmering M.D.   On: 12/23/2021 12:58    Cardiac Studies   Echo yesterday with worsening LV function, 20%    Assessment & Plan    Kalep Full is a 71 y.o. male with a hx of hypertension, hyperlipidemia, ischemic cardiomyopathy, congestive heart failure, coronary artery disease, peripheral vascular disease, diabetes mellitus admitted with GI bleed. Cardiology consulted for acute heart failure symptoms after transfusion.  #Acute on chronic systolic heart failure EF 20%. Warm and wet on exam.  - continue IV lasix 40mg  BID until euvolemic - cont farxiga, spironolactone - ARB/ARNi on hold given borderline low Bps  #Anemia GI following.   #CAD Cont statin  #HTN       For questions or updates, please contact [place] Please consult [email or web address] for contact info under        Signed, Vickie Epley,  MD  12/25/2021, 11:24 AM

## 2021-12-25 NOTE — Progress Notes (Signed)
PROGRESS NOTE   Bob Johnson  ZOX:[id number]    DOB: September 04, 1951    DOA: 12/22/2021  PCP: Fransisca Connors, FNP   I have briefly reviewed patients previous medical records in [facility].  Chief Complaint  Patient presents with   Abnormal Labs     Hospital Course:  71 year old married male, recently moved from [place] to [place] in November 2022, PCP and cardiology in [place], medical history significant for CAD, s/p anterior MI 2021 with bare-metal stenting to his LAD, ischemic cardiomyopathy, most recent EF from 2021 of 81%, chronic systolic CHF, HTN, HLD, DM2, PAD, penetrating atherosclerotic ulcer of aorta, former smoker, seen by his PCP on 12/22/2021 with complaints of new increased dyspnea with minimal exertion of 2 weeks duration, decreased appetite, approximately 19 pounds weight loss since November 2022, work-up revealed hemoglobin of 7.2 and patient was directed to ED for further evaluation and management.  Newly admitted for symptomatic anemia with Hemoccult positive stools.  Due to history of penetrating aortic ulcer, CTA chest, abdomen and pelvis performed.  Was read by radiologist as concerning for impending AAA rupture.  Patient transferred from [facility] to [facility], vascular surgery evaluated and are not concerned about any acute issues with the AAA.  On day of transfer 2/9, developed acute respiratory distress due to decompensated CHF, needing IV Lasix, transition to BiPAP.  Treating acute on chronic systolic CHF with IV Lasix.  Cardiology consulted.  [facility] on board and awaiting stabilization and clearance for colonoscopy (patient declined EGD), next possible date is 2/13.     Assessment & Plan:  Principal Problem:   Acute on chronic systolic CHF (congestive heart failure) (HCC) Active Problems:   Acute respiratory failure with hypoxia (HCC)   Symptomatic anemia   Aneurysm of infrarenal abdominal aorta   Bilateral pleural effusion   CAD (coronary artery disease)    Essential hypertension   Elevated troponin   Type 2 diabetes mellitus without complication, without long-term current use of insulin (HCC)   Mixed hyperlipidemia   Chronic kidney disease, stage 3a (HCC)   Hypokalemia   PAD (peripheral artery disease) (HCC)   Assessment and Plan: * Acute on chronic systolic CHF (congestive heart failure) ([place])- (present on admission) Ischemic cardiomyopathy Follows with Dr. Harlow Ohms, Cardiology at [facility]. TTE 09/03/2019: LVEF 40%, with inferior and septal akinesis. TTE 12/24/2021: LVEF 20-25%, LV global hypokinesis, grade 2 diastolic dysfunction.  Obviously worse than prior. Suspect that his presenting symptoms were likely due to decompensated CHF precipitated by worsening anemia. CHF was further worsened by 2 units of PRBC transfusion. Started on IV Lasix 20 mg twice daily on 2/9, increased today to 40 mg twice daily. Cardiology input appreciated: Adding Aldactone 12.5 Mg daily, Farxiga 10 mg daily. Home Toprol-XL and Entresto on hold due to borderline blood pressures on admission.  Resume as able. Volume status improving but still volume overloaded.  Intake and output do not appear to be accurate.  Put out 2.5 L urine yesterday.  Acute respiratory failure with hypoxia [facility]) Upon transfer from [facility] to [facility] on 2/9, patient was in respiratory extremis, rapid response was activated. Acute respiratory failure due to decompensated CHF complicated by bilateral pleural effusions, anemia and blood transfusion. Briefly on BiPAP, has been weaned off to [place] oxygen. Management of CHF and anemia as noted. Titrate oxygen to maintain saturations >92%.  BiPAP as needed. We will need to assess home oxygen needs prior to discharge. Chest x-ray from 2/11 personally reviewed, agree  with report that there is not much change from 2/9. Stable on 3 L/min [place] oxygen.  Bilateral pleural effusion- (present on admission) Likely due to CHF. Continue IV  Lasix.  Aneurysm of infrarenal abdominal aorta- (present on admission) Patient underwent CTA of chest abdomen and pelvis to evaluate for anemia given history of penetrating aortic ulcer. CT report was read as impending AAA rupture. Patient was transferred from [facility] to [facility] for vascular surgery evaluation. As per vascular surgery, patient has a 3.8 cm infrarenal abdominal aortic aneurysm with known mural thrombus.  He is not concerned about aortitis.  No surgical intervention indicated. Outpatient follow-up.  Vascular surgery signed off 2/11  Symptomatic anemia- (present on admission) Iron deficiency anemia: No report of overt GI bleeding. Most recent hemoglobin 13.3 on 02/12/2021.  Presented with hemoglobin of 7.1. FOBT +. Anemia panel: Ferritin 37, will check B12 and folate for completion. S/p 2 units PRBC transfusion and hemoglobin has appropriately improved to 9.9. [facility] were consulted, patient declined EGD, plan colonoscopy pending cardiovascular and respiratory stability and clearance. Aspirin on hold. Continue PPI. Stable.  CAD (coronary artery disease)- (present on admission) Ischemic cardiomyopathy Elevated troponin Patient past medical history significant for MI and Stent placement.  Continue to hold aspirin in the setting of possible GI bleed.   Metoprolol on hold due to soft blood pressures. Elevated troponin likely secondary to demand ischemia from decompensated CHF, acute respiratory failure and anemia.  Elevated troponin Demand ischemia.  Follow trend.  Essential hypertension- (present on admission) Patient on metoprolol, Entresto and Lasix at home. Toprol-XL and Entresto on hold due to soft blood pressures. Aldactone 12.5 mg daily added. Also on IV Lasix 40 mg every 12 hours.  Type 2 diabetes mellitus without complication, without long-term current use of insulin ([place]) Holding home metformin. Continue SSI. A1c 5.6 on 12/22/2021 suggesting tight control.  Chronic  kidney disease, stage 3a ([place])- (present on admission) Most recent creatinine appears to be in the 1.4-1.5 range. Current creatinine at baseline. Monitor BMP closely while on IV Lasix.  Mixed hyperlipidemia- (present on admission) Continue with Lipitor and Zetia.  Hypokalemia- (present on admission) Replaced. Follow BMP and magnesium in AM.  PAD (peripheral artery disease) ([place])- (present on admission) Continue statins and Zetia. Aspirin on hold due to FOBT + and anemia.    Body mass index is 22.25 kg/m.  Nutritional Status Nutrition Problem: Inadequate oral intake Etiology: inability to eat, altered GI function Signs/Symptoms: NPO status Interventions: Refer to RD note for recommendations     DVT prophylaxis: SCDs Start: 12/22/21 2125     Code Status: Full Code:  Family Communication: Discussed in detail with spouse at bedside, updated care and answered all questions. Disposition:  Status is: Inpatient Remains inpatient appropriate because: Severity of illness.     Consultants:   [facility] gastroenterology Vascular surgery Cardiology.  Procedures:   BiPAP  Antimicrobials:   None   Subjective:  Dyspnea better.  However patient has not been out of bed much.  No chest pain.  Objective:   Vitals:   12/25/21 0000 12/25/21 0547 12/25/21 0745 12/25/21 1113  BP: 118/67 112/65 110/70 101/66  Pulse: 97 92 85 94  Resp: 20 20 20  (!) 22  Temp: (!) 97.4 F (36.3 C) 97.6 F (36.4 C) 97.8 F (36.6 C) 97.8 F (36.6 C)  TempSrc: Axillary Axillary Axillary Oral  SpO2: 92% 95% 97% 95%  Weight:  76.5 kg    Height:        General  exam: Elderly male, moderately built and frail sitting up in bed comfortably without distress.  Looks much improved compared to yesterday. Respiratory system: Clear to auscultation anteriorly.  Diminished breath sounds posteriorly with few bibasal fine crackles.  No wheezing or rhonchi.  No increased work of breathing Cardiovascular system:  S1 & S2 heard, RRR. No JVD, murmurs, rubs, gallops or clicks. No pedal edema.  Telemetry personally reviewed: Sinus rhythm with BBB morphology. Gastrointestinal system: Abdomen is nondistended, soft and nontender. No organomegaly or masses felt. Normal bowel sounds heard. Central nervous system: Alert and oriented. No focal neurological deficits. Extremities: Symmetric 5 x 5 power. Skin: No rashes, lesions or ulcers Psychiatry: Judgement and insight appear normal. Mood & affect appropriate.     Data Reviewed:   I have personally reviewed following labs and imaging studies   CBC: Recent Labs  Lab 12/23/21 0530 12/24/21 0645 12/25/21 0205  WBC 10.1 11.9* 13.1*  HGB 8.4* 9.9* 10.1*  HCT 26.5* 31.7* 32.3*  MCV 89.8 91.4 90.7  PLT 206 204 341    Basic Metabolic Panel: Recent Labs  Lab 12/22/21 1941 12/23/21 0530 12/24/21 0645 12/25/21 0205  NA 143 142 144 142  K 3.3* 3.4* 4.1 3.5  CL 106 107 110 107  CO2 23 23 21* 22  GLUCOSE 160* 107* 108* 135*  BUN 29* 26* 20 21  CREATININE 1.40* 1.24 1.44* 1.47*  CALCIUM 8.9 8.6* 8.4* 8.3*    Liver Function Tests: Recent Labs  Lab 12/23/21 0530 12/25/21 0205  AST 15 12*  ALT 9 9  ALKPHOS 58 62  BILITOT 0.4 0.6  PROT 6.7 6.2*  ALBUMIN 3.0* 2.6*    CBG: Recent Labs  Lab 12/24/21 2101 12/25/21 0551 12/25/21 1155  GLUCAP 174* 127* 145*    Microbiology Studies:   Recent Results (from the past 240 hour(s))  Resp Panel by RT-PCR (Flu A&B, Covid) Nasopharyngeal Swab     Status: None   Collection Time: 12/22/21  9:45 PM   Specimen: Nasopharyngeal Swab; Nasopharyngeal(NP) swabs in vial transport medium  Result Value Ref Range Status   SARS Coronavirus 2 by RT PCR NEGATIVE NEGATIVE Final    Comment: (NOTE) SARS-CoV-2 target nucleic acids are NOT DETECTED.  The SARS-CoV-2 RNA is generally detectable in upper respiratory specimens during the acute phase of infection. The lowest concentration of SARS-CoV-2 viral copies  this assay can detect is 138 copies/mL. A negative result does not preclude SARS-Cov-2 infection and should not be used as the sole basis for treatment or other patient management decisions. A negative result may occur with  improper specimen collection/handling, submission of specimen other than nasopharyngeal swab, presence of viral mutation(s) within the areas targeted by this assay, and inadequate number of viral copies(<138 copies/mL). A negative result must be combined with clinical observations, patient history, and epidemiological information. The expected result is Negative.  Fact Sheet for Patients:  EntrepreneurPulse.com.au  Fact Sheet for Healthcare Providers:  IncredibleEmployment.be  This test is no t yet approved or cleared by the [place] FDA and  has been authorized for detection and/or diagnosis of SARS-CoV-2 by FDA under an Emergency Use Authorization (EUA). This EUA will remain  in effect (meaning this test can be used) for the duration of the COVID-19 declaration under Section 564(b)(1) of the Act, 21 U.S.C.section 360bbb-3(b)(1), unless the authorization is terminated  or revoked sooner.       Influenza A by PCR NEGATIVE NEGATIVE Final   Influenza B by PCR NEGATIVE NEGATIVE Final  Comment: (NOTE) The Xpert Xpress SARS-CoV-2/FLU/RSV plus assay is intended as an aid in the diagnosis of influenza from Nasopharyngeal swab specimens and should not be used as a sole basis for treatment. Nasal washings and aspirates are unacceptable for Xpert Xpress SARS-CoV-2/FLU/RSV testing.  Fact Sheet for Patients: EntrepreneurPulse.com.au  Fact Sheet for Healthcare Providers: IncredibleEmployment.be  This test is not yet approved or cleared by the [place] FDA and has been authorized for detection and/or diagnosis of SARS-CoV-2 by FDA under an Emergency Use Authorization (EUA). This EUA  will remain in effect (meaning this test can be used) for the duration of the COVID-19 declaration under Section 564(b)(1) of the Act, 21 U.S.C. section 360bbb-3(b)(1), unless the authorization is terminated or revoked.  Performed at [facility], [place] [address], [place], [place] [address]     Radiology Studies:  DG Chest 2 View  Result Date: 12/25/2021 CLINICAL DATA:  Acute congestive heart failure. EXAM: CHEST - 2 VIEW COMPARISON:  12/23/2021. FINDINGS: Since the prior study there has been no convincing change allowing for differences in patient positioning and technique. There are persistent opacities that extend from the perihilar regions to the lung bases, obscuring hemidiaphragms. Lung opacities are associated with moderate bilateral pleural effusions. There is also bilateral vascular congestion and interstitial thickening. No new lung abnormalities.  No pneumothorax. IMPRESSION: 1. No significant change from the exam is dated 12/23/2021. 2. Persistent lung opacities including vascular congestion and interstitial thickening, consistent with combination interstitial pulmonary edema, lung base atelectasis and moderate effusions. Electronically Signed   By: Lajean Manes M.D.   On: 12/25/2021 08:42   DG CHEST PORT 1 VIEW  Result Date: 12/23/2021 CLINICAL DATA:  Tachypnea EXAM: PORTABLE CHEST 1 VIEW COMPARISON:  CT 12/23/2021 FINDINGS: Cardiomegaly with vascular congestion and diffuse interstitial and ground-glass opacities suspicious for pulmonary edema. Small moderate bilateral effusions. Airspace disease at the bases. No pneumothorax. IMPRESSION: 1. Cardiomegaly with vascular congestion and diffuse interstitial and ground-glass opacity suspicious for pulmonary edema. Small-moderate bilateral effusions with basilar airspace disease Electronically Signed   By: Donavan Foil M.D.   On: 12/23/2021 17:02   ECHOCARDIOGRAM COMPLETE  Result Date: 12/24/2021    ECHOCARDIOGRAM  REPORT   Patient Name:   EASTER SCHINKE Date of Exam: 12/24/2021 Medical Rec #:  [id number]    Height:       73.0 in Accession #:    [id number]   Weight:       169.8 lb Date of Birth:  1951-06-04    BSA:          2.007 m Patient Age:    46 years     BP:           108/69 mmHg Patient Gender: M            HR:           101 bpm. Exam Location:  Inpatient Procedure: 2D Echo, Cardiac Doppler, Color Doppler and Intracardiac            Opacification Agent Indications:    CHF  History:        Patient has no prior history of Echocardiogram examinations.                 CAD; Risk Factors:Hypertension and Diabetes.  Sonographer:    Jyl Heinz Referring Phys: [place]  1. Left ventricular ejection fraction, by estimation, is 20 to 25%. The left ventricle has severely decreased function. The left ventricle  demonstrates global hypokinesis. The left ventricular internal cavity size was mildly dilated. Left ventricular diastolic parameters are consistent with Grade II diastolic dysfunction (pseudonormalization). Elevated left atrial pressure. 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  2. Right ventricular systolic function is normal. The right ventricular size is normal. There is moderately elevated pulmonary artery systolic pressure.  3. Left atrial size was severely dilated.  4. Right atrial size was mildly dilated.  5. A small pericardial effusion is present. The pericardial effusion is circumferential. There is no evidence of cardiac tamponade.  6. The mitral valve is normal in structure. Trivial mitral valve regurgitation.  7. Tricuspid valve regurgitation is moderate.  8. The aortic valve is tricuspid. Aortic valve regurgitation is not visualized. No aortic stenosis is present.  9. The inferior vena cava is  dilated in size with >50% respiratory variability, suggesting right atrial pressure of 8 mmHg. FINDINGS  Left Ventricle: No left ventricular thrombus is seen (definity contrast used). Left ventricular ejection fraction, by estimation, is 20 to 25%. The left ventricle has severely decreased function. The left ventricle demonstrates global hypokinesis. The left ventricular internal cavity size was mildly dilated. There is no left ventricular hypertrophy. Left ventricular diastolic parameters are consistent with Grade II diastolic dysfunction (pseudonormalization). Elevated left atrial pressure.  LV Wall Scoring: The inferior septum, entire inferior wall, basal anteroseptal segment, apical anterior segment, and apex are akinetic. The anterior wall, entire lateral wall, and mid anteroseptal segment are hypokinetic. 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 Right Ventricle: The right ventricular size is normal. No increase in right ventricular wall thickness. Right ventricular systolic function is normal. There is moderately elevated pulmonary artery systolic pressure. The tricuspid regurgitant velocity is 3.27 m/s, and with an assumed right atrial pressure of 8 mmHg, the estimated right ventricular systolic pressure is 91.4 mmHg. Left Atrium: Left atrial size was severely dilated. Right Atrium: Right atrial size was mildly dilated. Pericardium: A small pericardial effusion is present. The pericardial effusion is circumferential. There is no evidence of cardiac tamponade. Mitral Valve: The mitral valve is normal in structure. Trivial mitral valve regurgitation. Tricuspid Valve: The tricuspid valve is normal in structure. Tricuspid valve regurgitation is moderate. Aortic Valve: The aortic valve is tricuspid. Aortic valve  regurgitation is not visualized. No aortic stenosis is present. Aortic valve peak gradient measures 7.1 mmHg. Pulmonic Valve: The pulmonic valve was grossly normal. Pulmonic valve regurgitation is not visualized. Aorta: The aortic root and ascending aorta are structurally normal, with no evidence of dilitation. Venous: The inferior vena cava is dilated in size with greater than 50% respiratory variability, suggesting right atrial pressure of 8 mmHg. IAS/Shunts: No atrial level shunt detected by color flow Doppler.  LEFT VENTRICLE PLAX 2D LVIDd:         5.90 cm      Diastology LVIDs:         5.00 cm      LV e' medial:    9.64 cm/s LV PW:         1.00 cm      LV E/e' medial:  9.2 LV IVS:        0.70 cm  LV e' lateral:   11.20 cm/s LVOT diam:     2.00 cm      LV E/e' lateral: 7.9 LV SV:         46 LV SV Index:   23 LVOT Area:     3.14 cm  LV Volumes (MOD) LV vol d, MOD A2C: 237.0 ml LV vol d, MOD A4C: 198.0 ml LV vol s, MOD A2C: 174.0 ml LV vol s, MOD A4C: 146.0 ml LV SV MOD A2C:     63.0 ml LV SV MOD A4C:     198.0 ml LV SV MOD BP:      59.3 ml RIGHT VENTRICLE             IVC RV Basal diam:  3.60 cm     IVC diam: 2.20 cm RV Mid diam:    2.70 cm RV S prime:     13.40 cm/s TAPSE (M-mode): 1.9 cm LEFT ATRIUM             Index        RIGHT ATRIUM           Index LA diam:        3.90 cm 1.94 cm/m   RA Area:     18.00 cm LA Vol (A2C):   59.3 ml 29.55 ml/m  RA Volume:   48.90 ml  24.37 ml/m LA Vol (A4C):   65.2 ml 32.49 ml/m LA Biplane Vol: 66.7 ml 33.24 ml/m  AORTIC VALVE AV Area (Vmax): 2.26 cm AV Vmax:        133.00 cm/s AV Peak Grad:   7.1 mmHg LVOT Vmax:      95.80 cm/s LVOT Vmean:     61.900 cm/s LVOT VTI:       0.146 m  AORTA Ao Root diam: 3.60 cm Ao Asc diam:  2.80 cm MITRAL VALVE               TRICUSPID VALVE MV Area (PHT): 4.99 cm    TR Peak grad:   42.8 mmHg MV Decel Time: 152 msec    TR Vmax:        327.00 cm/s MV E velocity: 88.60 cm/s MV A velocity: 64.50 cm/s  SHUNTS MV E/A ratio:  1.37         Systemic VTI:  0.15 m                            Systemic Diam: 2.00 cm Mihai Croitoru MD Electronically signed by Sanda Klein MD Signature Date/Time: 12/24/2021/12:42:44 PM    Final     Scheduled Meds:    atorvastatin  80 mg Oral Daily   dapagliflozin propanediol  10 mg Oral Daily   ezetimibe  10 mg Oral Daily   furosemide  40 mg Intravenous Q12H   guaiFENesin  600 mg Oral BID   insulin aspart  0-9 Units Subcutaneous TID WC   sodium chloride flush  3 mL Intravenous Q12H   spironolactone  12.5 mg Oral Daily    Continuous Infusions:    pantoprazole 8 mg/hr (12/25/21 1114)     LOS: 1 day     Vernell Leep, MD,  FACP, [facility], [facility], [facility] (Care Management Physician Certified) [place]  To contact the attending provider between 7A-7P or the covering provider during after hours 7P-7A, please log into the web site [email or web address] and access using universal [place] password  for that web site. If you do not have the password, please call the hospital operator.  12/25/2021, 3:18 PM

## 2021-12-26 DIAGNOSIS — I5023 Acute on chronic systolic (congestive) heart failure: Secondary | ICD-10-CM | POA: Diagnosis not present

## 2021-12-26 LAB — BASIC METABOLIC PANEL
Anion gap: 12 (ref 5–15)
BUN: 19 mg/dL (ref 8–23)
CO2: 22 mmol/L (ref 22–32)
Calcium: 8.4 mg/dL — ABNORMAL LOW (ref 8.9–10.3)
Chloride: 107 mmol/L (ref 98–111)
Creatinine, Ser: 1.54 mg/dL — ABNORMAL HIGH (ref 0.61–1.24)
GFR, Estimated: 48 mL/min — ABNORMAL LOW (ref >60)
Glucose, Bld: 129 mg/dL — ABNORMAL HIGH (ref 70–99)
Potassium: 3.7 mmol/L (ref 3.5–5.1)
Sodium: 141 mmol/L (ref 135–145)

## 2021-12-26 LAB — GLUCOSE, CAPILLARY
Glucose-Capillary: 113 mg/dL — ABNORMAL HIGH (ref 70–99)
Glucose-Capillary: 173 mg/dL — ABNORMAL HIGH (ref 70–99)
Glucose-Capillary: 115 mg/dL — ABNORMAL HIGH (ref 70–99)
Glucose-Capillary: 117 mg/dL — ABNORMAL HIGH (ref 70–99)

## 2021-12-26 LAB — MAGNESIUM: Magnesium: 1.9 mg/dL (ref 1.7–2.4)

## 2021-12-26 MED ORDER — POTASSIUM CHLORIDE CRYS ER 20 MEQ PO TBCR
40.0000 meq | EXTENDED_RELEASE_TABLET | Freq: Once | ORAL | Status: AC
  Administered 2021-12-26: 40 meq via ORAL
  Filled 2021-12-26: qty 2

## 2021-12-26 NOTE — Progress Notes (Signed)
Progress Note  Patient Name: Bob Johnson Date of Encounter: 12/26/2021  [facility] Cardiologist: None   Subjective   NAEO. Feeling "30%" better.  Inpatient Medications    Scheduled Meds:  atorvastatin  80 mg Oral Daily   dapagliflozin propanediol  10 mg Oral Daily   ezetimibe  10 mg Oral Daily   furosemide  40 mg Intravenous Q12H   guaiFENesin  600 mg Oral BID   insulin aspart  0-9 Units Subcutaneous TID WC   pantoprazole  40 mg Oral Daily   sodium chloride flush  3 mL Intravenous Q12H   spironolactone  12.5 mg Oral Daily   Continuous Infusions:   PRN Meds: acetaminophen **OR** acetaminophen, ipratropium-albuterol   Vital Signs    Vitals:   12/26/21 0600 12/26/21 0832 12/26/21 0900 12/26/21 0906  BP:  (!) 107/52    Pulse:  91    Resp:  20 (!) 27 (!) 24  Temp:  97.6 F (36.4 C)    TempSrc:  Oral    SpO2:  94% 96% 94%  Weight: 76.3 kg     Height: 6\' 1"  (1.854 m)       Intake/Output Summary (Last 24 hours) at 12/26/2021 1041 Last data filed at 12/26/2021 1696 Gross per 24 hour  Intake 540 ml  Output 1460 ml  Net -920 ml    Last 3 Weights 12/26/2021 12/25/2021 12/23/2021  Weight (lbs) 168 lb 3.4 oz 168 lb 10.4 oz 169 lb 12.1 oz  Weight (kg) 76.3 kg 76.5 kg 77 kg      Telemetry    Personally Reviewed  ECG    Personally Reviewed  Physical Exam   GEN: No acute distress.  On O2 via [place]. Neck: No JVD. Mild iWOB. Cardiac: RRR, no murmurs, rubs, or gallops.  Respiratory: Decreased air movement. GI: Soft, nontender, non-distended  MS: No edema; No deformity. Neuro:  Nonfocal  Psych: Normal affect   Labs    High Sensitivity Troponin:   Recent Labs  Lab 12/24/21 1300 12/24/21 1452  TROPONINIHS 270* 302*      Chemistry Recent Labs  Lab 12/23/21 0530 12/24/21 0645 12/25/21 0205 12/26/21 0117  NA 142 144 142 141  K 3.4* 4.1 3.5 3.7  CL 107 110 107 107  CO2 23 21* 22 22  GLUCOSE 107* 108* 135* 129*  BUN 26* 20 21 19   CREATININE 1.24  1.44* 1.47* 1.54*  CALCIUM 8.6* 8.4* 8.3* 8.4*  PROT 6.7  --  6.2*  --   ALBUMIN 3.0*  --  2.6*  --   AST 15  --  12*  --   ALT 9  --  9  --   ALKPHOS 58  --  62  --   BILITOT 0.4  --  0.6  --   GFRNONAA >60 52* 51* 48*  ANIONGAP 12 13 13 12      Lipids No results for input(s): CHOL, TRIG, HDL, LABVLDL, LDLCALC, CHOLHDL in the last 168 hours.  Hematology Recent Labs  Lab 12/23/21 0530 12/24/21 0645 12/25/21 0205  WBC 10.1 11.9* 13.1*  RBC 2.95* 3.47* 3.56*  HGB 8.4* 9.9* 10.1*  HCT 26.5* 31.7* 32.3*  MCV 89.8 91.4 90.7  MCH 28.5 28.5 28.4  MCHC 31.7 31.2 31.3  RDW 16.4* 16.1* 15.7*  PLT 206 204 190    Thyroid No results for input(s): TSH, FREET4 in the last 168 hours.  BNPNo results for input(s): BNP, PROBNP in the last 168 hours.  DDimer No results  for input(s): DDIMER in the last 168 hours.   Radiology    DG Chest 2 View  Result Date: 12/25/2021 CLINICAL DATA:  Acute congestive heart failure. EXAM: CHEST - 2 VIEW COMPARISON:  12/23/2021. FINDINGS: Since the prior study there has been no convincing change allowing for differences in patient positioning and technique. There are persistent opacities that extend from the perihilar regions to the lung bases, obscuring hemidiaphragms. Lung opacities are associated with moderate bilateral pleural effusions. There is also bilateral vascular congestion and interstitial thickening. No new lung abnormalities.  No pneumothorax. IMPRESSION: 1. No significant change from the exam is dated 12/23/2021. 2. Persistent lung opacities including vascular congestion and interstitial thickening, consistent with combination interstitial pulmonary edema, lung base atelectasis and moderate effusions. Electronically Signed   By: Lajean Manes M.D.   On: 12/25/2021 08:42    Cardiac Studies   Echo yesterday with worsening LV function, 20%    Assessment & Plan    Bob Johnson is a 71 y.o. male with a hx of hypertension, hyperlipidemia, ischemic  cardiomyopathy, congestive heart failure, coronary artery disease, peripheral vascular disease, diabetes mellitus admitted with GI bleed. Cardiology consulted for acute heart failure symptoms after transfusion.  #Acute on chronic systolic heart failure EF 20%. Warm and wet on exam.  - continue IV lasix 40mg  BID until euvolemic. Likely transition to oral diuretics Monday - cont farxiga, spironolactone - ARB/ARNi on hold given borderline low Bps  #Anemia GI following.  Hgb stable.  #CAD Cont statin  #HTN       For questions or updates, please contact [place] Please consult [email or web address] for contact info under        Signed, Vickie Epley, MD  12/26/2021, 10:41 AM

## 2021-12-26 NOTE — Progress Notes (Signed)
Patient and his wife state that his respiratory status has not improved much, and do not want EGD/colonoscopy for tomorrow. Currently on heart healthy diet. Will reevaluate in a.m. tomorrow. If stable, possible EGD colonoscopy on Tuesday.

## 2021-12-26 NOTE — Progress Notes (Addendum)
PROGRESS NOTE   Bob Johnson  JME:[id number]    DOB: 03-21-1951    DOA: 12/22/2021  PCP: Fransisca Connors, FNP   I have briefly reviewed patients previous medical records in [facility].  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  Newly admitted for symptomatic anemia with Hemoccult positive stools.  Due to history of penetrating aortic ulcer, CTA chest, abdomen and pelvis performed.  Was read by radiologist as concerning for impending AAA rupture.  Patient transferred from [facility] to [facility], vascular surgery evaluated and are not concerned about any acute issues with the AAA.  On day of transfer 2/9, developed acute respiratory distress due to decompensated CHF, needing IV Lasix, transition to BiPAP.  Treating acute on chronic systolic CHF with IV Lasix.  Cardiology consulted.  [facility] on board and awaiting stabilization and clearance for colonoscopy (patient declined EGD), next possible date is 2/13.  Improving.     Assessment & Plan:  Principal Problem:   Acute on chronic systolic CHF (congestive heart failure) (HCC) Active Problems:   Acute respiratory failure with hypoxia (HCC)   Symptomatic anemia   Aneurysm of infrarenal abdominal aorta   Bilateral pleural effusion   CAD (coronary artery  disease)   Essential hypertension   Elevated troponin   Type 2 diabetes mellitus without complication, without long-term current use of insulin (HCC)   Mixed hyperlipidemia   Chronic kidney disease, stage 3a (HCC)   Hypokalemia   PAD (peripheral artery disease) (HCC)   Assessment and Plan: * Acute on chronic systolic CHF (congestive heart failure) ([place])- (present on admission) Ischemic cardiomyopathy Follows with Dr. Harlow Ohms, Cardiology at [facility]. TTE 09/03/2019: LVEF 40%, with inferior and septal akinesis. TTE 12/24/2021: LVEF 20-25%, LV global hypokinesis, grade 2 diastolic dysfunction.  Obviously worse than prior. Suspect that his presenting symptoms were likely due to decompensated CHF precipitated by worsening anemia. CHF was further worsened by 2 units of PRBC transfusion. Started on IV Lasix 20 mg twice daily on 2/9, increased today to 40 mg twice daily. Cardiology input appreciated: Adding Aldactone 12.5 Mg daily, Farxiga 10 mg daily. Home Toprol-XL and Entresto on hold due to borderline blood pressures on admission.  Resume as able. Volume status continues to improve.  Not much peripheral edema.  Intake output charting is inaccurate.  Continue IV Lasix 40 mg twice daily and hopefully can switch over to oral Lasix soon.  Acute respiratory failure with hypoxia [facility]) Upon transfer from [facility] to [facility] on 2/9, patient was in respiratory extremis, rapid response was activated. Acute respiratory failure due to decompensated CHF complicated by bilateral pleural effusions, anemia and blood transfusion. Briefly on BiPAP, has been weaned off to [place] oxygen. Management of CHF and anemia as noted. Titrate oxygen to maintain saturations >92%.  BiPAP as needed. We will need to assess home oxygen  needs prior to discharge. Chest x-ray from 2/11 personally reviewed, agree with report that there is not much change from 2/9. Stable on 3 L/min [place] oxygen.  Consider weaning  Bilateral  pleural effusion- (present on admission) Likely due to CHF. Continue IV Lasix.  Aneurysm of infrarenal abdominal aorta- (present on admission) Patient underwent CTA of chest abdomen and pelvis to evaluate for anemia given history of penetrating aortic ulcer. CT report was read as impending AAA rupture. Patient was transferred from [facility] to [facility] for vascular surgery evaluation. As per vascular surgery, patient has a 3.8 cm infrarenal abdominal aortic aneurysm with known mural thrombus.  He is not concerned about aortitis.  No surgical intervention indicated. Outpatient follow-up.  Vascular surgery signed off 2/11  Symptomatic anemia- (present on admission) Iron deficiency anemia: No report of overt GI bleeding. Most recent hemoglobin 13.3 on 02/12/2021.  Presented with hemoglobin of 7.1. FOBT +. Anemia panel: Ferritin 37, folate: 15.2 and B12: 460 S/p 2 units PRBC transfusion and hemoglobin has appropriately improved to 9.9 >10.1. [facility] were consulted, patient declined EGD, plan colonoscopy pending cardiovascular and respiratory stability and clearance. Aspirin on hold. Continue PPI. Stable.  CAD (coronary artery disease)- (present on admission) Ischemic cardiomyopathy Elevated troponin Patient past medical history significant for MI and Stent placement.  Continue to hold aspirin in the setting of possible GI bleed.   Metoprolol on hold due to soft blood pressures. Elevated troponin likely secondary to demand ischemia from decompensated CHF, acute respiratory failure and anemia.  Elevated troponin Demand ischemia.  Follow trend.  Essential hypertension- (present on admission) Patient on metoprolol, Entresto and Lasix at home. Toprol-XL and Entresto on hold due to soft blood pressures.  Blood pressures remain soft. Aldactone 12.5 mg daily added. Also on IV Lasix 40 mg every 12 hours.  Type 2 diabetes mellitus without complication, without long-term current use of insulin  ([place]) Holding home metformin. Continue SSI. A1c 5.6 on 12/22/2021 suggesting tight control.  Chronic kidney disease, stage 3a ([place])- (present on admission) Most recent creatinine appears to be in the 1.4-1.5 range. Current creatinine at baseline.  Creatinine 1.54 on 2/12 Monitor BMP closely while on IV Lasix.  Mixed hyperlipidemia- (present on admission) Continue with Lipitor and Zetia.  Hypokalemia- (present on admission) Replaced. Follow BMP and magnesium in AM.  PAD (peripheral artery disease) ([place])- (present on admission) Continue statins and Zetia. Aspirin on hold due to FOBT + and anemia.    Body mass index is 22.19 kg/m.  Nutritional Status Nutrition Problem: Inadequate oral intake Etiology: inability to eat, altered GI function Signs/Symptoms: NPO status Interventions: Refer to RD note for recommendations     DVT prophylaxis: SCDs Start: 12/22/21 2125     Code Status: Full Code:  Family Communication: Discussed in detail with spouse at bedside, updated care and answered all questions. Disposition:  Status is: Inpatient Remains inpatient appropriate because: Severity of illness.     Consultants:   [facility] gastroenterology Vascular surgery Cardiology.  Procedures:   BiPAP  Antimicrobials:   None   Subjective:  Dyspnea slowly improving.  No dyspnea at rest.  Feels that the SOB is better by about 30%.  Dyspnea on exertion.  Objective:   Vitals:   12/26/21 0832 12/26/21 0900 12/26/21 0906 12/26/21 1245  BP: (!) 107/52   (!) 113/59  Pulse: 91   98  Resp: 20 (!) 27 (!) 24 20  Temp: 97.6 F (36.4 C)   97.8 F (36.6 C)  TempSrc: Oral  Oral  SpO2: 94% 96% 94% 98%  Weight:      Height:        General exam: Elderly male, moderately built and frail sitting up in bed comfortably without distress.   Respiratory system: Few fine bibasilar crackles but otherwise clear to auscultation.  No increased work of breathing. Cardiovascular system: S1 & S2  heard, RRR. No JVD, murmurs, rubs, gallops or clicks. No pedal edema.  Telemetry personally reviewed: Sinus rhythm with BBB morphology.  6 beat NSVT. Gastrointestinal system: Abdomen is nondistended, soft and nontender. No organomegaly or masses felt. Normal bowel sounds heard. Central nervous system: Alert and oriented. No focal neurological deficits. Extremities: Symmetric 5 x 5 power. Skin: No rashes, lesions or ulcers Psychiatry: Judgement and insight appear normal. Mood & affect appropriate.     Data Reviewed:   I have personally reviewed following labs and imaging studies   CBC: Recent Labs  Lab 12/23/21 0530 12/24/21 0645 12/25/21 0205  WBC 10.1 11.9* 13.1*  HGB 8.4* 9.9* 10.1*  HCT 26.5* 31.7* 32.3*  MCV 89.8 91.4 90.7  PLT 206 204 951    Basic Metabolic Panel: Recent Labs  Lab 12/22/21 1941 12/23/21 0530 12/24/21 0645 12/25/21 0205 12/26/21 0117  NA 143 142 144 142 141  K 3.3* 3.4* 4.1 3.5 3.7  CL 106 107 110 107 107  CO2 23 23 21* 22 22  GLUCOSE 160* 107* 108* 135* 129*  BUN 29* 26* 20 21 19   CREATININE 1.40* 1.24 1.44* 1.47* 1.54*  CALCIUM 8.9 8.6* 8.4* 8.3* 8.4*    Liver Function Tests: Recent Labs  Lab 12/23/21 0530 12/25/21 0205  AST 15 12*  ALT 9 9  ALKPHOS 58 62  BILITOT 0.4 0.6  PROT 6.7 6.2*  ALBUMIN 3.0* 2.6*    CBG: Recent Labs  Lab 12/25/21 2148 12/26/21 0634 12/26/21 1219  GLUCAP 170* 113* 173*    Microbiology Studies:   Recent Results (from the past 240 hour(s))  Resp Panel by RT-PCR (Flu A&B, Covid) Nasopharyngeal Swab     Status: None   Collection Time: 12/22/21  9:45 PM   Specimen: Nasopharyngeal Swab; Nasopharyngeal(NP) swabs in vial transport medium  Result Value Ref Range Status   SARS Coronavirus 2 by RT PCR NEGATIVE NEGATIVE Final    Comment: (NOTE) SARS-CoV-2 target nucleic acids are NOT DETECTED.  The SARS-CoV-2 RNA is generally detectable in upper respiratory specimens during the acute phase of infection.  The lowest concentration of SARS-CoV-2 viral copies this assay can detect is 138 copies/mL. A negative result does not preclude SARS-Cov-2 infection and should not be used as the sole basis for treatment or other patient management decisions. A negative result may occur with  improper specimen collection/handling, submission of specimen other than nasopharyngeal swab, presence of viral mutation(s) within the areas targeted by this assay, and inadequate number of viral copies(<138 copies/mL). A negative result must be combined with clinical observations, patient history, and epidemiological information. The expected result is Negative.  Fact Sheet for Patients:  EntrepreneurPulse.com.au  Fact Sheet for Healthcare Providers:  IncredibleEmployment.be  This test is no t yet approved or cleared by the [place] FDA and  has been authorized for detection and/or diagnosis of SARS-CoV-2 by FDA under an Emergency Use Authorization (EUA). This EUA will remain  in effect (meaning this test can be used) for the duration of the COVID-19 declaration under Section 564(b)(1) of the Act, 21 U.S.C.section 360bbb-3(b)(1), unless the authorization is terminated  or revoked sooner.  Influenza A by PCR NEGATIVE NEGATIVE Final   Influenza B by PCR NEGATIVE NEGATIVE Final    Comment: (NOTE) The Xpert Xpress SARS-CoV-2/FLU/RSV plus assay is intended as an aid in the diagnosis of influenza from Nasopharyngeal swab specimens and should not be used as a sole basis for treatment. Nasal washings and aspirates are unacceptable for Xpert Xpress SARS-CoV-2/FLU/RSV testing.  Fact Sheet for Patients: EntrepreneurPulse.com.au  Fact Sheet for Healthcare Providers: IncredibleEmployment.be  This test is not yet approved or cleared by the [place] FDA and has been authorized for detection and/or diagnosis of SARS-CoV-2 by FDA  under an Emergency Use Authorization (EUA). This EUA will remain in effect (meaning this test can be used) for the duration of the COVID-19 declaration under Section 564(b)(1) of the Act, 21 U.S.C. section 360bbb-3(b)(1), unless the authorization is terminated or revoked.  Performed at [facility], [place] [address] [place] [address]     Radiology Studies:  DG Chest 2 View  Result Date: 12/25/2021 CLINICAL DATA:  Acute congestive heart failure. EXAM: CHEST - 2 VIEW COMPARISON:  12/23/2021. FINDINGS: Since the prior study there has been no convincing change allowing for differences in patient positioning and technique. There are persistent opacities that extend from the perihilar regions to the lung bases, obscuring hemidiaphragms. Lung opacities are associated with moderate bilateral pleural effusions. There is also bilateral vascular congestion and interstitial thickening. No new lung abnormalities.  No pneumothorax. IMPRESSION: 1. No significant change from the exam is dated 12/23/2021. 2. Persistent lung opacities including vascular congestion and interstitial thickening, consistent with combination interstitial pulmonary edema, lung base atelectasis and moderate effusions. Electronically Signed   By: Lajean Manes M.D.   On: 12/25/2021 08:42    Scheduled Meds:    atorvastatin  80 mg Oral Daily   dapagliflozin propanediol  10 mg Oral Daily   ezetimibe  10 mg Oral Daily   furosemide  40 mg Intravenous Q12H   guaiFENesin  600 mg Oral BID   insulin aspart  0-9 Units Subcutaneous TID WC   pantoprazole  40 mg Oral Daily   sodium chloride flush  3 mL Intravenous Q12H   spironolactone  12.5 mg Oral Daily    Continuous Infusions:       LOS: 2 days     Vernell Leep, MD,  FACP, [facility], [facility], [facility] (Care Management Physician Certified) [place]  To contact the attending provider between 7A-7P or the covering provider  during after hours 7P-7A, please log into the web site [email or web address] and access using universal [place] password for that web site. If you do not have the password, please call the hospital operator.  12/26/2021, 2:54 PM

## 2021-12-27 ENCOUNTER — Other Ambulatory Visit: Payer: Self-pay

## 2021-12-27 DIAGNOSIS — I10 Essential (primary) hypertension: Secondary | ICD-10-CM

## 2021-12-27 DIAGNOSIS — I2510 Atherosclerotic heart disease of native coronary artery without angina pectoris: Secondary | ICD-10-CM

## 2021-12-27 DIAGNOSIS — N1831 Chronic kidney disease, stage 3a: Secondary | ICD-10-CM

## 2021-12-27 DIAGNOSIS — I7143 Infrarenal abdominal aortic aneurysm, without rupture: Secondary | ICD-10-CM

## 2021-12-27 DIAGNOSIS — E119 Type 2 diabetes mellitus without complications: Secondary | ICD-10-CM

## 2021-12-27 DIAGNOSIS — I739 Peripheral vascular disease, unspecified: Secondary | ICD-10-CM

## 2021-12-27 LAB — BASIC METABOLIC PANEL
Anion gap: 12 (ref 5–15)
BUN: 20 mg/dL (ref 8–23)
CO2: 23 mmol/L (ref 22–32)
Calcium: 8.6 mg/dL — ABNORMAL LOW (ref 8.9–10.3)
Chloride: 104 mmol/L (ref 98–111)
Creatinine, Ser: 1.58 mg/dL — ABNORMAL HIGH (ref 0.61–1.24)
GFR, Estimated: 47 mL/min — ABNORMAL LOW (ref >60)
Glucose, Bld: 116 mg/dL — ABNORMAL HIGH (ref 70–99)
Potassium: 3.8 mmol/L (ref 3.5–5.1)
Sodium: 139 mmol/L (ref 135–145)

## 2021-12-27 LAB — CBC
HCT: 31.4 % — ABNORMAL LOW (ref 39.0–52.0)
Hemoglobin: 9.8 g/dL — ABNORMAL LOW (ref 13.0–17.0)
MCH: 28.5 pg (ref 26.0–34.0)
MCHC: 31.2 g/dL (ref 30.0–36.0)
MCV: 91.3 fL (ref 80.0–100.0)
Platelets: 163 10*3/uL (ref 150–400)
RBC: 3.44 MIL/uL — ABNORMAL LOW (ref 4.22–5.81)
RDW: 15.4 % (ref 11.5–15.5)
WBC: 11.5 10*3/uL — ABNORMAL HIGH (ref 4.0–10.5)
nRBC: 0 % (ref 0.0–0.2)

## 2021-12-27 LAB — GLUCOSE, CAPILLARY
Glucose-Capillary: 109 mg/dL — ABNORMAL HIGH (ref 70–99)
Glucose-Capillary: 118 mg/dL — ABNORMAL HIGH (ref 70–99)
Glucose-Capillary: 111 mg/dL — ABNORMAL HIGH (ref 70–99)
Glucose-Capillary: 116 mg/dL — ABNORMAL HIGH (ref 70–99)

## 2021-12-27 MED ORDER — POTASSIUM CHLORIDE CRYS ER 20 MEQ PO TBCR
40.0000 meq | EXTENDED_RELEASE_TABLET | Freq: Once | ORAL | Status: AC
  Administered 2021-12-27: 40 meq via ORAL
  Filled 2021-12-27: qty 2

## 2021-12-27 MED ORDER — SACUBITRIL-VALSARTAN 24-26 MG PO TABS
1.0000 | ORAL_TABLET | Freq: Two times a day (BID) | ORAL | Status: DC
  Administered 2021-12-27 – 2022-01-01 (×11): 1 via ORAL
  Filled 2021-12-27 (×11): qty 1

## 2021-12-27 MED ORDER — MAGNESIUM SULFATE 2 GM/50ML IV SOLN
2.0000 g | Freq: Once | INTRAVENOUS | Status: AC
  Administered 2021-12-27: 2 g via INTRAVENOUS
  Filled 2021-12-27: qty 50

## 2021-12-27 NOTE — Progress Notes (Signed)
[facility]  Subjective  - no abdominal or back pain   Objective 114/64 93 (!) 97.3 F (36.3 C) (Oral) (!) 22 93%  Intake/Output Summary (Last 24 hours) at 12/27/2021 1441 Last data filed at 12/27/2021 1000 Gross per 24 hour  Intake 360 ml  Output 800 ml  Net -440 ml   No abdominal pain on exam  Laboratory Lab Results: Recent Labs    12/25/21 0205 12/27/21 0620  WBC 13.1* 11.5*  HGB 10.1* 9.8*  HCT 32.3* 31.4*  PLT 190 163   BMET Recent Labs    12/26/21 0117 12/27/21 0620  NA 141 139  K 3.7 3.8  CL 107 104  CO2 22 23  GLUCOSE 129* 116*  BUN 19 20  CREATININE 1.54* 1.58*  CALCIUM 8.4* 8.6*    COAG No results found for: INR, PROTIME No results found for: PTT  Assessment/Planning:  Transferred here for evaluation of AAA.  Again no abdominal or back pain today or through the weekend.  AAA identified on CT at [facility] in 2021.  Will arrange 6 month follow-up with me office.  AAA is 3.8 cm with mural thrombus.  No suspicion for aortitis at this time and presented with SOB and anemia.  Marty Heck 12/27/2021 2:41 PM --

## 2021-12-27 NOTE — Progress Notes (Signed)
Progress Note  Patient Name: Bob Johnson Date of Encounter: 12/27/2021  [facility] Cardiologist: None ([facility], Dr. Flonnie Overman)  Subjective   Denies dyspnea.  Was able to walk to the nursing station and back without oxygen, but he is still dyspneic at rest and has orthopnea.  Weight is unchanged from yesterday.  Net diuresis 2.1 L since admission. Creatinine stable at 1.5-1.6. Hemoglobin stable around 10 since transfusion  Inpatient Medications    Scheduled Meds:  atorvastatin  80 mg Oral Daily   dapagliflozin propanediol  10 mg Oral Daily   ezetimibe  10 mg Oral Daily   furosemide  40 mg Intravenous Q12H   guaiFENesin  600 mg Oral BID   insulin aspart  0-9 Units Subcutaneous TID WC   pantoprazole  40 mg Oral Daily   sodium chloride flush  3 mL Intravenous Q12H   spironolactone  12.5 mg Oral Daily   Continuous Infusions:  PRN Meds: acetaminophen **OR** acetaminophen, ipratropium-albuterol   Vital Signs    Vitals:   12/27/21 0317 12/27/21 0500 12/27/21 0600 12/27/21 0747  BP: 114/69   103/87  Pulse: 97   88  Resp: 20   18  Temp: 97.9 F (36.6 C)   97.6 F (36.4 C)  TempSrc: Oral   Oral  SpO2: 92%   95%  Weight:  75.4 kg 75.4 kg   Height:   6' 0.1" (1.831 m)     Intake/Output Summary (Last 24 hours) at 12/27/2021 0929 Last data filed at 12/27/2021 0644 Gross per 24 hour  Intake 240 ml  Output 800 ml  Net -560 ml   Last 3 Weights 12/27/2021 12/27/2021 12/26/2021  Weight (lbs) 166 lb 3.6 oz 166 lb 3.6 oz 168 lb 3.4 oz  Weight (kg) 75.4 kg 75.4 kg 76.3 kg      Telemetry    Sinus rhythm with PACs- Personally Reviewed  ECG    Sinus tachycardia with PACs, RBBB, anteroseptal infarction age undetermined, no acute ischemic changes- Personally Reviewed  Physical Exam  Appears comfortable, but is sitting up at about 60 degree head of bed elevation GEN: No acute distress.   Neck: No JVD Cardiac: RRR, no murmurs, rubs, or gallops.  Respiratory: Clear to auscultation  bilaterally. GI: Soft, nontender, non-distended  MS: No edema; No deformity. Neuro:  Nonfocal  Psych: Normal affect   Labs    High Sensitivity Troponin:   Recent Labs  Lab 12/24/21 1300 12/24/21 1452  TROPONINIHS 270* 302*     Chemistry Recent Labs  Lab 12/23/21 0530 12/24/21 0645 12/25/21 0205 12/26/21 0117 12/27/21 0620  NA 142   < > 142 141 139  K 3.4*   < > 3.5 3.7 3.8  CL 107   < > 107 107 104  CO2 23   < > 22 22 23   GLUCOSE 107*   < > 135* 129* 116*  BUN 26*   < > 21 19 20   CREATININE 1.24   < > 1.47* 1.54* 1.58*  CALCIUM 8.6*   < > 8.3* 8.4* 8.6*  MG  --   --   --  1.9  --   PROT 6.7  --  6.2*  --   --   ALBUMIN 3.0*  --  2.6*  --   --   AST 15  --  12*  --   --   ALT 9  --  9  --   --   ALKPHOS 58  --  62  --   --  BILITOT 0.4  --  0.6  --   --   GFRNONAA >60   < > 51* 48* 47*  ANIONGAP 12   < > 13 12 12    < > = values in this interval not displayed.    Lipids No results for input(s): CHOL, TRIG, HDL, LABVLDL, LDLCALC, CHOLHDL in the last 168 hours.  Hematology Recent Labs  Lab 12/23/21 0530 12/24/21 0645 12/25/21 0205  WBC 10.1 11.9* 13.1*  RBC 2.95* 3.47* 3.56*  HGB 8.4* 9.9* 10.1*  HCT 26.5* 31.7* 32.3*  MCV 89.8 91.4 90.7  MCH 28.5 28.5 28.4  MCHC 31.7 31.2 31.3  RDW 16.4* 16.1* 15.7*  PLT 206 204 190   Thyroid No results for input(s): TSH, FREET4 in the last 168 hours.  BNPNo results for input(s): BNP, PROBNP in the last 168 hours.  DDimer No results for input(s): DDIMER in the last 168 hours.   Radiology    No results found.  Cardiac Studies   Echocardiogram 12/24/2021  1. Left ventricular ejection fraction, by estimation, is 20 to 25%. The  left ventricle has severely decreased function. The left ventricle  demonstrates global hypokinesis. The left ventricular internal cavity size  was mildly dilated. Left ventricular  diastolic parameters are consistent with Grade II diastolic dysfunction  (pseudonormalization). Elevated  left atrial pressure. Although there is  global hypokinesis, there is near akinesis and thinning of the myocardium  in the inferior wall, inferior  septum, anterior septum and apex, suggesting infarction in the right  coronary artery and LAD artery territories.   2. Right ventricular systolic function is normal. The right ventricular  size is normal. There is moderately elevated pulmonary artery systolic  pressure.   3. Left atrial size was severely dilated.   4. Right atrial size was mildly dilated.   5. A small pericardial effusion is present. The pericardial effusion is  circumferential. There is no evidence of cardiac tamponade.   6. The mitral valve is normal in structure. Trivial mitral valve  regurgitation.   7. Tricuspid valve regurgitation is moderate.   8. The aortic valve is tricuspid. Aortic valve regurgitation is not  visualized. No aortic stenosis is present.   9. The inferior vena cava is dilated in size with >50% respiratory  variability, suggesting right atrial pressure of 8 mmHg.   Patient Profile     71 y.o. 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  Assessment & Plan    CHF: Likely decompensation due to severe anemia and volume load with transfusion.  Improved with diuretics but remains dyspneic and hypoxemic at rest, will need additional diuresis.  On spironolactone and [place].  Metoprolol and Entresto on hold due to low blood pressure.  EF on echocardiogram performed during this admission is substantially lower than previously estimated echo in October 2020 at  [facility], but he has not had angina or any other indication of acute coronary insufficiency.  Further coronary evaluation is not appropriate at this time with suspected bleeding. Anemia: Stable hemoglobin.  Need to optimize heart failure/respiratory status before he can undergo EGD and colonoscopy. CAD: Did not have angina even when he was  markedly anemic and in the throes of CHF exacerbation.  Aspirin on hold due to suspected GI bleeding.  On statin. HTN: Blood pressure is improving.  We will resume low-dose Entresto.  Restart beta-blocker tomorrow if blood pressure permits. AAA: 3.8 cm, infrarenal.  Concern for inflammatory changes on CT, but asymptomatic, seen by vascular surgery with no intervention planned at this time.     For questions or updates, please contact [place] Please consult [email or web address] for contact info under        Signed, Sanda Klein, MD  12/27/2021, 9:29 AM

## 2021-12-27 NOTE — Progress Notes (Signed)
[facility]ology Progress Note  [facility] 71 y.o. Aug 31, 1951  CC: Anemia, heme positive stool.   Subjective: Patient seen and examined at bedside.  Continues to have some shortness of breath.  Remains on 3 to 4 L of oxygen.  Family at bedside.  No overt GI bleeding.  ROS : Positive for shortness of breath.  Negative for abdominal pain   Objective: Vital signs in last 24 hours: Vitals:   12/27/21 0317 12/27/21 0747  BP: 114/69 103/87  Pulse: 97 88  Resp: 20 18  Temp: 97.9 F (36.6 C) 97.6 F (36.4 C)  SpO2: 92% 95%    Physical Exam:  General:  Alert, cooperative, no distress, appears stated age.  Oxygen by nasal cannula  Head:  Normocephalic, without obvious abnormality, atraumatic  Eyes:  , EOM's intact,   Lungs:   Basilar crackles noted  Heart:  Regular rate and rhythm, S1, S2 normal  Abdomen:   Soft, non-tender, nondistended, bowel sounds present.  No peritoneal signs  Extremities: Extremities normal, atraumatic, no  edema  Psych Mood and affect normal     Lab Results: Recent Labs    12/26/21 0117 12/27/21 0620  NA 141 139  K 3.7 3.8  CL 107 104  CO2 22 23  GLUCOSE 129* 116*  BUN 19 20  CREATININE 1.54* 1.58*  CALCIUM 8.4* 8.6*  MG 1.9  --    Recent Labs    12/25/21 0205  AST 12*  ALT 9  ALKPHOS 62  BILITOT 0.6  PROT 6.2*  ALBUMIN 2.6*   Recent Labs    12/25/21 0205  WBC 13.1*  HGB 10.1*  HCT 32.3*  MCV 90.7  PLT 190   No results for input(s): LABPROT, INR in the last 72 hours.    Assessment/Plan: -Symptomatic anemia with heme positive stool.  No overt bleeding -Ischemic cardiomyopathy.  EF has dropped to 20 to 25%.  Recommendations ------------------------- -Continue supportive care for now. -Patient continues to have shortness of breath and I do not think he would be an ideal candidate for EGD and colonoscopy at this time. -May benefit from EGD and colonoscopy once cardiac status has been optimized -Appreciate  cardiology input. -GI will follow   [name] MD, [place] 12/27/2021, 9:29 AM  Contact #  [phone]

## 2021-12-27 NOTE — Progress Notes (Signed)
Physical Therapy Treatment Patient Details Name: Bob Johnson MRN: [id number] DOB: 04/18/1951 Today's Date: 12/27/2021   History of Present Illness Patient is a 71 y/o male who presents on 12/23/21 from PCP office due to abnormal labs. Found to have GI bleed, symptomatic anemia, SOB and acute on chronic heart failure. PMH includes CAD, DM, HTN, PVD, Ischemic cardiomyopathy, CHF    PT Comments    Patient progressing slowly towards PT goals. Session focused on progressive ambulation, rollator training and activity tolerance. Able to stand for ~3 minutes to urinate prior to ambulation. Tolerated gait training with use of rollator, requiring longer seated rest break (>5 mins) half way due to 3/4 DOE. Sp02 remained >90% on 1L/min 02 Bob Johnson with activity; left pt on RA to attempt weaning especially at rest. RN made aware. Encouraged walking with mobility tech later today. Encouraged more short bouts of activity with longer rest breaks instead of longer distance. Will likely be able to wean off 02 prior to d/c. Will plan for stair training next session as tolerated.   Recommendations for follow up therapy are one component of a multi-disciplinary discharge planning process, led by the attending physician.  Recommendations may be updated based on patient status, additional functional criteria and insurance authorization.  Follow Up Recommendations  No PT follow up     Assistance Recommended at Discharge Intermittent Supervision/Assistance  Patient can return home with the following Assistance with cooking/housework;A little help with walking and/or transfers;Help with stairs or ramp for entrance   Equipment Recommendations  Rollator (4 wheels)    Recommendations for Other Services       Precautions / Restrictions Precautions Precautions: Fall;Other (comment) Precaution Comments: watch 02 Restrictions Weight Bearing Restrictions: No     Mobility  Bed Mobility Overal bed mobility: Modified  Independent             General bed mobility comments: Use of rails, no physical assist.    Transfers Overall transfer level: Needs assistance Equipment used: Rollator (4 wheels) Transfers: Sit to/from Stand Sit to Stand: Min guard           General transfer comment: Min guard for safety. Stood from EOB without difficulty, cues needed to not pull up on rollator.    Ambulation/Gait Ambulation/Gait assistance: Min guard Gait Distance (Feet): 100 Feet (x2 bouts) Assistive device: Rollator (4 wheels) Gait Pattern/deviations: Step-through pattern, Decreased stride length, Trunk flexed Gait velocity: decreased Gait velocity interpretation: 1.31 - 2.62 ft/sec, indicative of limited community ambulator   General Gait Details: Slow, mostly steady gait with 3/4 DOE, 1 seated rest break needed due to fatigue/SOB (>5 mins), Sp02 stayed >90% on 1L/min 02 Bob Johnson.   Stairs             Wheelchair Mobility    Modified Rankin (Stroke Patients Only)       Balance Overall balance assessment: Needs assistance Sitting-balance support: Feet supported, No upper extremity supported Sitting balance-Leahy Scale: Good     Standing balance support: During functional activity Standing balance-Leahy Scale: Fair Standing balance comment: Able to stand statically for a few mins with UE support to urinate                            Cognition Arousal/Alertness: Awake/alert Behavior During Therapy: WFL for tasks assessed/performed Overall Cognitive Status: Within Functional Limits for tasks assessed  General Comments: for basic mobility tasks        Exercises      General Comments General comments (skin integrity, edema, etc.): Bob Johnson present.      Pertinent Vitals/Pain Pain Assessment Pain Assessment: No/denies pain    Home Living                          Prior Function            PT Goals (current goals  can now be found in the care plan section) Progress towards PT goals: Progressing toward goals    Frequency    Min 3X/week      PT Plan Current plan remains appropriate    Co-evaluation              AM-PAC PT "6 Clicks" Mobility   Outcome Measure  Help needed turning from your back to your side while in a flat bed without using bedrails?: None Help needed moving from lying on your back to sitting on the side of a flat bed without using bedrails?: None Help needed moving to and from a bed to a chair (including a wheelchair)?: A Little Help needed standing up from a chair using your arms (e.g., wheelchair or bedside chair)?: A Little Help needed to walk in hospital room?: A Little Help needed climbing 3-5 steps with a railing? : A Little 6 Click Score: 20    End of Session Equipment Utilized During Treatment: Gait belt;Oxygen Activity Tolerance: Patient tolerated treatment well;Treatment limited secondary to medical complications (Comment) (SOB) Patient left: with call bell/phone within reach;in bed;with family/visitor present Nurse Communication: Mobility status;Other (comment) (02 sats) PT Visit Diagnosis: Other (comment);Unsteadiness on feet (R26.81);Other abnormalities of gait and mobility (R26.89) (SOB)     Time: 5053-9767 PT Time Calculation (min) (ACUTE ONLY): 25 min  Charges:  $Gait Training: 8-22 mins $Therapeutic Activity: 8-22 mins                     Bob Johnson, PT, DPT Acute Rehabilitation Services Pager [phone] Office [phone]      [place] 12/27/2021, 11:13 AM

## 2021-12-27 NOTE — Progress Notes (Signed)
Bipap is now PRN.  RT will cont to monitor.

## 2021-12-27 NOTE — Progress Notes (Signed)
SATURATION QUALIFICATIONS: (This note is used to comply with regulatory documentation for home oxygen)  Patient Saturations on Room Air at Rest = 89%  Patient Saturations on Room Air while Ambulating = NA  Patient Saturations on 1 Liters of oxygen while Ambulating = 90%  Please briefly explain why patient needs home oxygen: Pt not able to maintain Sp02 >90% on RA with activity at this time so will need supplemental 02.   Zettie Cooley, DPT Acute Rehabilitation Services Pager [phone] Office [phone]

## 2021-12-27 NOTE — Progress Notes (Signed)
PROGRESS NOTE   Bob Johnson  FXT:[id number]    DOB: October 04, 1951    DOA: 12/22/2021  PCP: Fransisca Connors, FNP   I have briefly reviewed patients previous medical records in [facility].  Chief Complaint  Patient presents with   Abnormal Labs     Hospital Course:  71 year old married male, recently moved from [place] to [place] in November 2022, PCP and cardiology in [place], medical history significant for CAD, s/p anterior MI 2021 with bare-metal stenting to his LAD, ischemic cardiomyopathy, most recent EF from 2021 of 29%, chronic systolic CHF, HTN, HLD, DM2, PAD, penetrating atherosclerotic ulcer of aorta, former smoker, seen by his PCP on 12/22/2021 with complaints of new increased dyspnea with minimal exertion of 2 weeks duration, decreased appetite, approximately 19 pounds weight loss since November 2022, work-up revealed hemoglobin of 7.2 and patient was directed to ED for further evaluation and management.  Newly admitted for symptomatic anemia with Hemoccult positive stools.  Due to history of penetrating aortic ulcer, CTA chest, abdomen and pelvis performed.  Was read by radiologist as concerning for impending AAA rupture.  Patient transferred [facility] to [facility], vascular surgery evaluated and are not concerned about any acute issues with the AAA.  On day of transfer 2/9, developed acute respiratory distress due to decompensated CHF, needing IV Lasix, transition to BiPAP.  Treating acute on chronic systolic CHF with IV Lasix.  Cardiology consulted.  [facility] on board and awaiting stabilization and clearance for colonoscopy (patient declined EGD), next possible date is 2/13.  Improving but still symptomatic.     Assessment & Plan:  Principal Problem:   Acute on chronic systolic CHF (congestive heart failure) (HCC) Active Problems:   Acute respiratory failure with hypoxia (HCC)   Symptomatic anemia   Aneurysm of infrarenal abdominal aorta   Bilateral pleural effusion    CAD (coronary artery disease)   Essential hypertension   Elevated troponin   Type 2 diabetes mellitus without complication, without long-term current use of insulin (HCC)   Mixed hyperlipidemia   Chronic kidney disease, stage 3a (HCC)   Hypokalemia   PAD (peripheral artery disease) (HCC)   Assessment and Plan: * Acute on chronic systolic CHF (congestive heart failure) ([place])- (present on admission) Ischemic cardiomyopathy Follows with Dr. Harlow Ohms, Cardiology at [facility]. TTE 09/03/2019: LVEF 40%, with inferior and septal akinesis. TTE 12/24/2021: LVEF 20-25%, LV global hypokinesis, grade 2 diastolic dysfunction.  Obviously worse than prior. Suspect that his presenting symptoms were likely due to decompensated CHF precipitated by worsening anemia. CHF was further worsened by 2 units of PRBC transfusion. Started on IV Lasix 20 mg twice daily on 2/9, increased today to 40 mg twice daily. Cardiology input appreciated: Adding Aldactone 12.5 Mg daily, Farxiga 10 mg daily. Toprol-XL and Entresto had been held.  Restarting Entresto today. Volume status continues to improve.  Not much peripheral edema.  -1.6 L charted but not sure if this is totally accurate. Per cardiology, remains dyspneic and hypoxemic at rest,?  Increase Lasix dose.?  Add metolazone.  Acute respiratory failure with hypoxia [facility]) Upon transfer from [facility] to [facility] on 2/9, patient was in respiratory extremis, rapid response was activated. Acute respiratory failure due to decompensated CHF complicated by bilateral pleural effusions, anemia and blood transfusion. Briefly on BiPAP, has been weaned off to [place] oxygen. Management of CHF and anemia as noted. Titrate oxygen to maintain saturations >92%.  BiPAP as needed. We will need to assess home oxygen  needs prior to discharge. Chest x-ray from 2/11 personally reviewed, agree with report that there is not much change from 2/9. Still symptomatic of dyspnea at rest and  hypoxic per cardiology.  With physical therapy, 89% on room air at rest and on 1 L of oxygen while ambulating, 90%.  Bilateral pleural effusion- (present on admission) Likely due to CHF. Continue IV Lasix.  Aneurysm of infrarenal abdominal aorta- (present on admission) Patient underwent CTA of chest abdomen and pelvis to evaluate for anemia given history of penetrating aortic ulcer. CT report was read as impending AAA rupture. Patient was transferred from [facility] to [facility] for vascular surgery evaluation. As per vascular surgery, patient has a 3.8 cm infrarenal abdominal aortic aneurysm with known mural thrombus.  He is not concerned about aortitis.  No surgical intervention indicated. Vascular surgery follow-up appreciated.  They will arrange 31-month follow-up.  Symptomatic anemia- (present on admission) Iron deficiency anemia: No report of overt GI bleeding. Most recent hemoglobin 13.3 on 02/12/2021.  Presented with hemoglobin of 7.1. FOBT +. Anemia panel: Ferritin 37, folate: 15.2 and B12: 460 S/p 2 units PRBC transfusion and hemoglobin has appropriately improved to 9.9 >10.1. [facility] were consulted, patient declined EGD, plan colonoscopy pending cardiovascular and respiratory stability and clearance. Aspirin on hold. Continue PPI. Stable.  CAD (coronary artery disease)- (present on admission) Ischemic cardiomyopathy Elevated troponin Patient past medical history significant for MI and Stent placement.  Continue to hold aspirin in the setting of possible GI bleed.   Metoprolol on hold due to soft blood pressures. Elevated troponin likely secondary to demand ischemia from decompensated CHF, acute respiratory failure and anemia.  Elevated troponin Demand ischemia.  Follow trend.  Essential hypertension- (present on admission) Patient on metoprolol, Entresto and Lasix at home. Toprol-XL and Entresto on hold due to soft blood pressures.  Blood pressures remain soft. Aldactone 12.5 mg  daily added. Also on IV Lasix 40 mg every 12 hours.  Type 2 diabetes mellitus without complication, without long-term current use of insulin ([place]) Holding home metformin. Continue SSI. A1c 5.6 on 12/22/2021 suggesting tight control.  Chronic kidney disease, stage 3a ([place])- (present on admission) Most recent creatinine appears to be in the 1.4-1.5 range. Current creatinine at baseline.  Creatinine 1.54 >1.58 over the last 2 days Monitor BMP closely while on IV Lasix.  Mixed hyperlipidemia- (present on admission) Continue with Lipitor and Zetia.  Hypokalemia- (present on admission) Replaced. Follow BMP and magnesium in AM.  PAD (peripheral artery disease) ([place])- (present on admission) Continue statins and Zetia. Aspirin on hold due to FOBT + and anemia.    Body mass index is 22.48 kg/m.  Nutritional Status Nutrition Problem: Inadequate oral intake Etiology: inability to eat, altered GI function Signs/Symptoms: NPO status Interventions: Refer to RD note for recommendations     DVT prophylaxis: SCDs Start: 12/22/21 2125     Code Status: Full Code:  Family Communication: Discussed in detail with spouse at bedside, updated care and answered all questions. Disposition:  Status is: Inpatient Remains inpatient appropriate because: Severity of illness.     Consultants:   [facility] gastroenterology Vascular surgery Cardiology.  Procedures:   BiPAP  Antimicrobials:   None   Subjective:  Reports that his dyspnea is "60% better".  No other complaints reported.  Objective:   Vitals:   12/27/21 0600 12/27/21 0747 12/27/21 1120 12/27/21 1536  BP:  103/87 114/64 101/74  Pulse:  88 93 90  Resp:  18 (!) 22 19  Temp:  97.6 F (36.4 C) (!) 97.3 F (36.3 C) (!) 97.5 F (36.4 C)  TempSrc:  Oral Oral Oral  SpO2:  95% 93% 96%  Weight: 75.4 kg     Height: 6' 0.1" (1.831 m)       General exam: Elderly male, moderately built and frail sitting up in bed comfortably without  distress.   Respiratory system: Occasional bibasilar crackles.  Otherwise clear to auscultation.  No increased work of breathing. Cardiovascular system: S1 and S2 heard, RRR.  No JVD, or pedal edema.  2/6 systolic murmur best heard at the right lower sternal edge.  Telemetry personally reviewed: Sinus rhythm.  No further NSVT noted. Gastrointestinal system: Abdomen is nondistended, soft and nontender. No organomegaly or masses felt. Normal bowel sounds heard. Central nervous system: Alert and oriented. No focal neurological deficits. Extremities: Symmetric 5 x 5 power. Skin: No rashes, lesions or ulcers Psychiatry: Judgement and insight appear normal. Mood & affect appropriate.     Data Reviewed:   I have personally reviewed following labs and imaging studies   CBC: Recent Labs  Lab 12/24/21 0645 12/25/21 0205 12/27/21 0620  WBC 11.9* 13.1* 11.5*  HGB 9.9* 10.1* 9.8*  HCT 31.7* 32.3* 31.4*  MCV 91.4 90.7 91.3  PLT 204 190 732    Basic Metabolic Panel: Recent Labs  Lab 12/23/21 0530 12/24/21 0645 12/25/21 0205 12/26/21 0117 12/27/21 0620  NA 142 144 142 141 139  K 3.4* 4.1 3.5 3.7 3.8  CL 107 110 107 107 104  CO2 23 21* 22 22 23   GLUCOSE 107* 108* 135* 129* 116*  BUN 26* 20 21 19 20   CREATININE 1.24 1.44* 1.47* 1.54* 1.58*  CALCIUM 8.6* 8.4* 8.3* 8.4* 8.6*  MG  --   --   --  1.9  --     Liver Function Tests: Recent Labs  Lab 12/23/21 0530 12/25/21 0205  AST 15 12*  ALT 9 9  ALKPHOS 58 62  BILITOT 0.4 0.6  PROT 6.7 6.2*  ALBUMIN 3.0* 2.6*    CBG: Recent Labs  Lab 12/26/21 2109 12/27/21 0626 12/27/21 1118  GLUCAP 117* 109* 118*    Microbiology Studies:   Recent Results (from the past 240 hour(s))  Resp Panel by RT-PCR (Flu A&B, Covid) Nasopharyngeal Swab     Status: None   Collection Time: 12/22/21  9:45 PM   Specimen: Nasopharyngeal Swab; Nasopharyngeal(NP) swabs in vial transport medium  Result Value Ref Range Status   SARS Coronavirus 2 by  RT PCR NEGATIVE NEGATIVE Final    Comment: (NOTE) SARS-CoV-2 target nucleic acids are NOT DETECTED.  The SARS-CoV-2 RNA is generally detectable in upper respiratory specimens during the acute phase of infection. The lowest concentration of SARS-CoV-2 viral copies this assay can detect is 138 copies/mL. A negative result does not preclude SARS-Cov-2 infection and should not be used as the sole basis for treatment or other patient management decisions. A negative result may occur with  improper specimen collection/handling, submission of specimen other than nasopharyngeal swab, presence of viral mutation(s) within the areas targeted by this assay, and inadequate number of viral copies(<138 copies/mL). A negative result must be combined with clinical observations, patient history, and epidemiological information. The expected result is Negative.  Fact Sheet for Patients:  EntrepreneurPulse.com.au  Fact Sheet for Healthcare Providers:  IncredibleEmployment.be  This test is no t yet approved or cleared by the [place] FDA and  has been authorized for detection and/or diagnosis of SARS-CoV-2 by FDA under an  Emergency Use Authorization (EUA). This EUA will remain  in effect (meaning this test can be used) for the duration of the COVID-19 declaration under Section 564(b)(1) of the Act, 21 U.S.C.section 360bbb-3(b)(1), unless the authorization is terminated  or revoked sooner.       Influenza A by PCR NEGATIVE NEGATIVE Final   Influenza B by PCR NEGATIVE NEGATIVE Final    Comment: (NOTE) The Xpert Xpress SARS-CoV-2/FLU/RSV plus assay is intended as an aid in the diagnosis of influenza from Nasopharyngeal swab specimens and should not be used as a sole basis for treatment. Nasal washings and aspirates are unacceptable for Xpert Xpress SARS-CoV-2/FLU/RSV testing.  Fact Sheet for Patients: EntrepreneurPulse.com.au  Fact Sheet  for Healthcare Providers: IncredibleEmployment.be  This test is not yet approved or cleared by the [place] FDA and has been authorized for detection and/or diagnosis of SARS-CoV-2 by FDA under an Emergency Use Authorization (EUA). This EUA will remain in effect (meaning this test can be used) for the duration of the COVID-19 declaration under Section 564(b)(1) of the Act, 21 U.S.C. section 360bbb-3(b)(1), unless the authorization is terminated or revoked.  Performed at [facility], [place] [address], [place], [place] [address]     Radiology Studies:  No results found.  Scheduled Meds:    atorvastatin  80 mg Oral Daily   dapagliflozin propanediol  10 mg Oral Daily   ezetimibe  10 mg Oral Daily   furosemide  40 mg Intravenous Q12H   guaiFENesin  600 mg Oral BID   insulin aspart  0-9 Units Subcutaneous TID WC   pantoprazole  40 mg Oral Daily   sacubitril-valsartan  1 tablet Oral BID   sodium chloride flush  3 mL Intravenous Q12H   spironolactone  12.5 mg Oral Daily    Continuous Infusions:       LOS: 3 days     Vernell Leep, MD,  FACP, [facility], [facility], [facility] (Care Management Physician Certified) [place]  To contact the attending provider between 7A-7P or the covering provider during after hours 7P-7A, please log into the web site [email or web address] and access using universal [place] password for that web site. If you do not have the password, please call the hospital operator.  12/27/2021, 3:49 PM

## 2021-12-28 LAB — GLUCOSE, CAPILLARY
Glucose-Capillary: 109 mg/dL — ABNORMAL HIGH (ref 70–99)
Glucose-Capillary: 123 mg/dL — ABNORMAL HIGH (ref 70–99)
Glucose-Capillary: 124 mg/dL — ABNORMAL HIGH (ref 70–99)
Glucose-Capillary: 133 mg/dL — ABNORMAL HIGH (ref 70–99)

## 2021-12-28 LAB — BASIC METABOLIC PANEL
Anion gap: 11 (ref 5–15)
BUN: 21 mg/dL (ref 8–23)
CO2: 21 mmol/L — ABNORMAL LOW (ref 22–32)
Calcium: 8.5 mg/dL — ABNORMAL LOW (ref 8.9–10.3)
Chloride: 107 mmol/L (ref 98–111)
Creatinine, Ser: 1.53 mg/dL — ABNORMAL HIGH (ref 0.61–1.24)
GFR, Estimated: 49 mL/min — ABNORMAL LOW (ref >60)
Glucose, Bld: 118 mg/dL — ABNORMAL HIGH (ref 70–99)
Potassium: 3.9 mmol/L (ref 3.5–5.1)
Sodium: 139 mmol/L (ref 135–145)

## 2021-12-28 LAB — MAGNESIUM: Magnesium: 2.5 mg/dL — ABNORMAL HIGH (ref 1.7–2.4)

## 2021-12-28 NOTE — Progress Notes (Signed)
Physical Therapy Treatment Patient Details Name: Bob Johnson MRN: [id number] DOB: October 20, 1951 Today's Date: 12/28/2021   History of Present Illness Patient is a 71 y/o male who presents on 12/23/21 from PCP office due to abnormal labs. Found to have GI bleed, symptomatic anemia, SOB and acute on chronic heart failure. PMH includes CAD, DM, HTN, PVD, Ischemic cardiomyopathy, CHF    PT Comments    Patient progressing well towards PT goals. Tolerated short bouts of activity with longer rest breaks today and able to maintain Sp02 >93% on RA. Continues to demonstrate decreased endurance, activity tolerance and dyspnea on exertion requiring  longer seated rest breaks during mobility. Does well having rollator for support as well as energy conservation. Recommend continued mobility walking to/from bathroom. Will follow.   Recommendations for follow up therapy are one component of a multi-disciplinary discharge planning process, led by the attending physician.  Recommendations may be updated based on patient status, additional functional criteria and insurance authorization.  Follow Up Recommendations  No PT follow up     Assistance Recommended at Discharge Intermittent Supervision/Assistance  Patient can return home with the following Assistance with cooking/housework;A little help with walking and/or transfers;Help with stairs or ramp for entrance   Equipment Recommendations  Rollator (4 wheels)    Recommendations for Other Services       Precautions / Restrictions Precautions Precautions: Fall;Other (comment) Precaution Comments: watch 02 Restrictions Weight Bearing Restrictions: No     Mobility  Bed Mobility Overal bed mobility: Modified Independent             General bed mobility comments: Use of rails, no physical assist.    Transfers Overall transfer level: Needs assistance Equipment used: Rollator (4 wheels), None Transfers: Sit to/from Stand Sit to Stand: Min  guard           General transfer comment: Min guard for safety. Stood from [employer], from rollator seatx2 without difficulty, cues needed to not pull up on rollator. Stood from toilet x1    Ambulation/Gait Ambulation/Gait assistance: Counsellor (Feet): 40 Feet (+40' + 40' + 40') Assistive device: Rollator (4 wheels) Gait Pattern/deviations: Step-through pattern, Decreased stride length, Trunk flexed Gait velocity: decreased     General Gait Details: Slow, mostly steady gait with 2/4 DOE, 2 seated rest breaks. Sp02 stayed >92% on RA.   Stairs             Wheelchair Mobility    Modified Rankin (Stroke Patients Only)       Balance Overall balance assessment: Needs assistance Sitting-balance support: Feet supported, No upper extremity supported Sitting balance-Leahy Scale: Good     Standing balance support: During functional activity Standing balance-Leahy Scale: Fair Standing balance comment: Able to stand statically for a few mins with UE support.                            Cognition Arousal/Alertness: Awake/alert Behavior During Therapy: WFL for tasks assessed/performed Overall Cognitive Status: Within Functional Limits for tasks assessed                                          Exercises      General Comments General comments (skin integrity, edema, etc.): Wife present      Pertinent Vitals/Pain Pain Assessment Pain Assessment: No/denies pain    Home Living  Prior Function            PT Goals (current goals can now be found in the care plan section) Progress towards PT goals: Progressing toward goals    Frequency    Min 3X/week      PT Plan Current plan remains appropriate    Co-evaluation              AM-PAC PT "6 Clicks" Mobility   Outcome Measure  Help needed turning from your back to your side while in a flat bed without using bedrails?:  None Help needed moving from lying on your back to sitting on the side of a flat bed without using bedrails?: None Help needed moving to and from a bed to a chair (including a wheelchair)?: A Little Help needed standing up from a chair using your arms (e.g., wheelchair or bedside chair)?: A Little Help needed to walk in hospital room?: A Little Help needed climbing 3-5 steps with a railing? : A Little 6 Click Score: 20    End of Session Equipment Utilized During Treatment: Gait belt Activity Tolerance: Patient tolerated treatment well Patient left: in bed;with call bell/phone within reach;with family/visitor present Nurse Communication: Mobility status PT Visit Diagnosis: Unsteadiness on feet (R26.81);Other abnormalities of gait and mobility (R26.89)     Time: 0051-1021 PT Time Calculation (min) (ACUTE ONLY): 30 min  Charges:  $Gait Training: 23-37 mins                     Marisa Severin, PT, DPT Acute Rehabilitation Services Pager [phone] Office [phone]      [place] 12/28/2021, 12:29 PM

## 2021-12-28 NOTE — Progress Notes (Signed)
[facility] Gastroenterology Progress Note  [facility] 71 y.o. 1951/10/23  CC: Anemia, heme positive stool.   Subjective: Patient seen and examined at bedside.  He is feeling better . Now on 1 to 2 L oxygen but still gets dyspnea with minimal exertion.  ROS : Positive for shortness of breath.  Negative for abdominal pain   Objective: Vital signs in last 24 hours: Vitals:   12/28/21 0723 12/28/21 1124  BP: 106/62 123/62  Pulse: 79 89  Resp: 20 (!) 22  Temp: (!) 97.5 F (36.4 C) (!) 97.4 F (36.3 C)  SpO2: 95% 94%    Physical Exam:  General:  Alert, cooperative, no distress, appears stated age.  Oxygen by nasal cannula  Head:  Normocephalic, without obvious abnormality, atraumatic  Eyes:  , EOM's intact,   Lungs:   Basilar crackles noted  Heart:  Regular rate and rhythm, S1, S2 normal  Abdomen:   Soft, non-tender, nondistended, bowel sounds present.  No peritoneal signs  Extremities: Extremities normal, atraumatic, no  edema  Psych Mood and affect normal     Lab Results: Recent Labs    12/26/21 0117 12/27/21 0620 12/28/21 0205  NA 141 139 139  K 3.7 3.8 3.9  CL 107 104 107  CO2 22 23 21*  GLUCOSE 129* 116* 118*  BUN 19 20 21   CREATININE 1.54* 1.58* 1.53*  CALCIUM 8.4* 8.6* 8.5*  MG 1.9  --  2.5*   No results for input(s): AST, ALT, ALKPHOS, BILITOT, PROT, ALBUMIN in the last 72 hours.  Recent Labs    12/27/21 0620  WBC 11.5*  HGB 9.8*  HCT 31.4*  MCV 91.3  PLT 163   No results for input(s): LABPROT, INR in the last 72 hours.    Assessment/Plan: -Symptomatic anemia with heme positive stool.  No overt bleeding -Ischemic cardiomyopathy.  EF has dropped to 20 to 25%.  Recommendations ------------------------- -Continue supportive care for now. -According to patient, he is not ready yet for colonoscopy prep. -Okay to start 81 mg aspirin from GI standpoint -Repeat CBC in the morning -May benefit from EGD and colonoscopy once cardiac status has been  optimized -GI will follow   [name] MD, FACP 12/28/2021, 11:52 AM  Contact #  [phone]

## 2021-12-28 NOTE — Care Management Important Message (Signed)
Important Message  Patient Details  Name: Bob Johnson MRN: [id number] Date of Birth: 23-Oct-1951   Medicare Important Message Given:  Yes     [name] 12/28/2021, 8:22 AM

## 2021-12-28 NOTE — Progress Notes (Addendum)
Progress Note  Patient Name: Bob Johnson Date of Encounter: 12/28/2021  [facility] Cardiologist: Kirk Ruths, MD ([facility], [place])  Subjective   He is currently sitting up in bed without O2 satting 95%, but gets very dyspneic with minimal walking (to the bathroom).   Inpatient Medications    Scheduled Meds:  atorvastatin  80 mg Oral Daily   dapagliflozin propanediol  10 mg Oral Daily   ezetimibe  10 mg Oral Daily   furosemide  40 mg Intravenous Q12H   guaiFENesin  600 mg Oral BID   insulin aspart  0-9 Units Subcutaneous TID WC   pantoprazole  40 mg Oral Daily   sacubitril-valsartan  1 tablet Oral BID   sodium chloride flush  3 mL Intravenous Q12H   spironolactone  12.5 mg Oral Daily   Continuous Infusions:  PRN Meds: acetaminophen **OR** acetaminophen, ipratropium-albuterol   Vital Signs    Vitals:   12/27/21 2314 12/28/21 0407 12/28/21 0631 12/28/21 0723  BP: 108/64 107/71  106/62  Pulse: 87 80  79  Resp: 18 20  20   Temp: (!) 97.4 F (36.3 C) (!) 97.5 F (36.4 C)  (!) 97.5 F (36.4 C)  TempSrc: Oral Oral  Oral  SpO2: 93% 93%  95%  Weight:   73.5 kg   Height:        Intake/Output Summary (Last 24 hours) at 12/28/2021 0938 Last data filed at 12/28/2021 2409 Gross per 24 hour  Intake 720.73 ml  Output 1600 ml  Net -879.27 ml   Last 3 Weights 12/28/2021 12/27/2021 12/27/2021  Weight (lbs) 162 lb 166 lb 3.6 oz 166 lb 3.6 oz  Weight (kg) 73.483 kg 75.4 kg 75.4 kg      Telemetry    Sinus rhythm in the 70s with PACs - Personally Reviewed  ECG    No new tracings - Personally Reviewed  Physical Exam   GEN: No acute distress.   Neck: + JVD Cardiac: RRR, no murmurs, rubs, or gallops.  Respiratory: crackles in bases GI: Soft, nontender, non-distended  MS: No edema; No deformity. Neuro:  Nonfocal  Psych: Normal affect   Labs    High Sensitivity Troponin:   Recent Labs  Lab 12/24/21 1300 12/24/21 1452  TROPONINIHS 270* 302*      Chemistry Recent Labs  Lab 12/23/21 0530 12/24/21 0645 12/25/21 0205 12/26/21 0117 12/27/21 0620 12/28/21 0205  NA 142   < > 142 141 139 139  K 3.4*   < > 3.5 3.7 3.8 3.9  CL 107   < > 107 107 104 107  CO2 23   < > 22 22 23  21*  GLUCOSE 107*   < > 135* 129* 116* 118*  BUN 26*   < > 21 19 20 21   CREATININE 1.24   < > 1.47* 1.54* 1.58* 1.53*  CALCIUM 8.6*   < > 8.3* 8.4* 8.6* 8.5*  MG  --   --   --  1.9  --  2.5*  PROT 6.7  --  6.2*  --   --   --   ALBUMIN 3.0*  --  2.6*  --   --   --   AST 15  --  12*  --   --   --   ALT 9  --  9  --   --   --   ALKPHOS 58  --  62  --   --   --   BILITOT 0.4  --  0.6  --   --   --   GFRNONAA >60   < > 51* 48* 47* 49*  ANIONGAP 12   < > 13 12 12 11    < > = values in this interval not displayed.    Lipids No results for input(s): CHOL, TRIG, HDL, LABVLDL, LDLCALC, CHOLHDL in the last 168 hours.  Hematology Recent Labs  Lab 12/24/21 0645 12/25/21 0205 12/27/21 0620  WBC 11.9* 13.1* 11.5*  RBC 3.47* 3.56* 3.44*  HGB 9.9* 10.1* 9.8*  HCT 31.7* 32.3* 31.4*  MCV 91.4 90.7 91.3  MCH 28.5 28.4 28.5  MCHC 31.2 31.3 31.2  RDW 16.1* 15.7* 15.4  PLT 204 190 163   Thyroid No results for input(s): TSH, FREET4 in the last 168 hours.  BNPNo results for input(s): BNP, PROBNP in the last 168 hours.  DDimer No results for input(s): DDIMER in the last 168 hours.   Radiology    No results found.  Cardiac Studies   Echo 12/24/21: 1. Left ventricular ejection fraction, by estimation, is 20 to 25%. The  left ventricle has severely decreased function. The left ventricle  demonstrates global hypokinesis. The left ventricular internal cavity size  was mildly dilated. Left ventricular  diastolic parameters are consistent with Grade II diastolic dysfunction  (pseudonormalization). Elevated left atrial pressure. Although there is  global hypokinesis, there is near akinesis and thinning of the myocardium  in the inferior wall, inferior  septum,  anterior septum and apex, suggesting infarction in the right  coronary artery and LAD artery territories.   2. Right ventricular systolic function is normal. The right ventricular  size is normal. There is moderately elevated pulmonary artery systolic  pressure.   3. Left atrial size was severely dilated.   4. Right atrial size was mildly dilated.   5. A small pericardial effusion is present. The pericardial effusion is  circumferential. There is no evidence of cardiac tamponade.   6. The mitral valve is normal in structure. Trivial mitral valve  regurgitation.   7. Tricuspid valve regurgitation is moderate.   8. The aortic valve is tricuspid. Aortic valve regurgitation is not  visualized. No aortic stenosis is present.   9. The inferior vena cava is dilated in size with >50% respiratory  variability, suggesting right atrial pressure of 8 mmHg.  Patient Profile     72 y.o. 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  Assessment & Plan    Acute on chronic systolic and diastolic heart failure Ischemic cardiomyopathy CKD stage III - LVEF 20-25% with grade II DD - down from LVEF 40% ([facility]) - exacerbation felt due to anemia and volume with transfusion - currently diuresing on 40 mg IV lasix BID - 1.6 L urine output yesterday - weight down to 162 lbs today from a peak of 169 lbs - GDMT includes: farxiga, entresto, and spironolactone - BiPAP now PRN and he has weaned off O2 at rest, still SOB with minimal activity - he is unable to sleep flat even at home, but it appears we are making  progress - renal function stable, K 3.9 - BP borderline in the 824M systolic -  unclear if he will tolerate titration of lasix or entresto, considering adding a third dose of 40 mg IV lasix today   Anemia - Hb appears stable - awaiting stabilization of fluid status prior to GI workup   CAD - no angina - hs troponin 270 --> 302 - suspect demand ischemia in the setting of anemia and CHF - hold ASA given anemia   AAA - 3.8 cm on CT at [facility] 2021 - follow up will be arranged with [facility] in 6 months - asymptomatic     For questions or updates, please contact [facility] Please consult [email or web address] for contact info under        Signed, Ledora Bottcher, PA  12/28/2021, 9:38 AM     I have seen and examined the patient along with Ledora Bottcher, PA .  I have reviewed the chart, notes and new data.  I agree with PA's note.  Key new complaints: He is able to lie fully supine in bed without oxygen and appears comfortable, no dyspnea.  He does become dyspneic on walking.  Steadily diuresing approximately 1 L/day. Key examination changes: No longer has overt findings for hypervolemia on exam (JVP is not elevated, no edema, clear lungs). Key new findings / data: Creatinine stable at approximately 1.5, hemoglobin stable at approximately 10 (latter was not rechecked today).  PLAN: I think that he is hemodynamically compensated at this point to undergo EGD and colonoscopy.  Anticipate he may actually lose a little extra volume with colonoscopy prep and I do not think he needs higher doses of diuretics.  Sanda Klein, MD, [place] [phone] 12/28/2021, 12:41 PM

## 2021-12-28 NOTE — Progress Notes (Signed)
PROGRESS NOTE   Bob Johnson  JYN:[id number]    DOB: 1950-11-28    DOA: 12/22/2021  PCP: Fransisca Connors, FNP   I have briefly reviewed patients previous medical records in [facility].  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  Newly admitted for symptomatic anemia with Hemoccult positive stools.  Due to history of penetrating aortic ulcer, CTA chest, abdomen and pelvis performed.  Was read by radiologist as concerning for impending AAA rupture.  Patient transferred from [facility] to [facility], vascular surgery evaluated and are not concerned about any acute issues with the AAA.  On day of transfer 2/9, developed acute respiratory distress due to decompensated CHF, needing IV Lasix, transition to BiPAP.  Treating acute on chronic systolic CHF with IV Lasix.  Cardiology consulted.  [facility] on board and awaiting stabilization and clearance for colonoscopy (patient declined EGD).  Improving.  Cardiology has cleared for EGD/colonoscopy.     Assessment & Plan:  Principal Problem:   Acute on chronic systolic CHF (congestive heart failure) (HCC) Active Problems:   Acute respiratory failure with hypoxia (HCC)   Symptomatic anemia   Aneurysm of infrarenal abdominal aorta   Bilateral pleural effusion   CAD  (coronary artery disease)   Essential hypertension   Elevated troponin   Type 2 diabetes mellitus without complication, without long-term current use of insulin (HCC)   Mixed hyperlipidemia   Chronic kidney disease, stage 3a (HCC)   Hypokalemia   PAD (peripheral artery disease) (HCC)   Assessment and Plan: * Acute on chronic systolic CHF (congestive heart failure) ([place])- (present on admission) Ischemic cardiomyopathy Follows with Dr. Harlow Ohms, Cardiology at [facility]. TTE 09/03/2019: LVEF 40%, with inferior and septal akinesis. TTE 12/24/2021: LVEF 20-25%, LV global hypokinesis, grade 2 diastolic dysfunction.  Obviously worse than prior. Suspect that his presenting symptoms were likely due to decompensated CHF precipitated by worsening anemia. CHF was further worsened by 2 units of PRBC transfusion. Started on IV Lasix 20 mg twice daily on 2/9, increased today to 40 mg twice daily. Cardiology input appreciated: Added Aldactone 12.5 Mg daily, Farxiga 10 mg daily. Toprol-XL and Entresto had been held.  Restarted Entresto. Volume status continues to improve.  Not much peripheral edema.  -3.1 L charted but not sure if this is totally accurate. Cardiology follow-up appreciated.  Ongoing dyspnea with minimal exertion.  However no dyspnea at rest or on lying flat.  Cardiology has cleared him for EGD/colonoscopy.  I have updated [facility] regarding this. Improving  Acute respiratory failure with hypoxia [facility]) Upon transfer from [facility] to [facility] on 2/9, patient was in respiratory extremis, rapid response was activated. Acute respiratory failure due to decompensated CHF complicated by bilateral pleural effusions, anemia and blood transfusion. Briefly on BiPAP, has been weaned off to [place] oxygen. Management of CHF and anemia as noted.  Titrate oxygen to maintain saturations >92%.  BiPAP as needed. We will need to assess home oxygen needs prior to discharge. Chest x-ray from 2/11 personally  reviewed, agree with report that there is not much change from 2/9. Improved.  Not hypoxic on room air.  Requiring only 1 L/min with activity on 2/13.  Bilateral pleural effusion- (present on admission) Likely due to CHF. Continue IV Lasix.  Aneurysm of infrarenal abdominal aorta- (present on admission) Patient underwent CTA of chest abdomen and pelvis to evaluate for anemia given history of penetrating aortic ulcer. CT report was read as impending AAA rupture. Patient was transferred from [facility] to [facility] for vascular surgery evaluation. As per vascular surgery, patient has a 3.8 cm infrarenal abdominal aortic aneurysm with known mural thrombus.  He is not concerned about aortitis.  No surgical intervention indicated. Vascular surgery follow-up appreciated.  They will arrange 4-month follow-up.  Symptomatic anemia- (present on admission) Iron deficiency anemia: No report of overt GI bleeding. Most recent hemoglobin 13.3 on 02/12/2021.  Presented with hemoglobin of 7.1. FOBT +. Anemia panel: Ferritin 37, folate: 15.2 and B12: 460 S/p 2 units PRBC transfusion and hemoglobin has appropriately improved to 9.9 >10.1. [facility] were consulted, patient declined EGD, plan colonoscopy pending cardiovascular and respiratory stability and clearance. Aspirin on hold. Continue PPI. Stable.  CAD (coronary artery disease)- (present on admission) Ischemic cardiomyopathy Elevated troponin Patient past medical history significant for MI and Stent placement.  Continue to hold aspirin in the setting of possible GI bleed.   Metoprolol on hold due to soft blood pressures. Elevated troponin likely secondary to demand ischemia from decompensated CHF, acute respiratory failure and anemia.  Elevated troponin Demand ischemia.  Follow trend.  Essential hypertension- (present on admission) Patient on metoprolol, Entresto and Lasix at home. Toprol-XL and Entresto on hold due to soft blood pressures.  Blood pressures  remain soft. Aldactone 12.5 mg daily added. Also on IV Lasix 40 mg every 12 hours.  Type 2 diabetes mellitus without complication, without long-term current use of insulin ([place]) Holding home metformin. Continue SSI. A1c 5.6 on 12/22/2021 suggesting tight control.  Chronic kidney disease, stage 3a ([place])- (present on admission) Most recent creatinine appears to be in the 1.4-1.5 range. Current creatinine at baseline.  Creatinine 1.54 >1.58 over the last 2 days Monitor BMP closely while on IV Lasix.  Mixed hyperlipidemia- (present on admission) Continue with Lipitor and Zetia.  Hypokalemia- (present on admission) Replaced. Follow BMP and magnesium in AM.  PAD (peripheral artery disease) ([place])- (present on admission) Continue statins and Zetia. Aspirin on hold due to FOBT + and anemia.    Body mass index is 21.91 kg/m.  Nutritional Status Nutrition Problem: Inadequate oral intake Etiology: inability to eat, altered GI function Signs/Symptoms: NPO status Interventions: Refer to RD note for recommendations     DVT prophylaxis: SCDs Start: 12/22/21 2125     Code Status: Full Code:  Family Communication: Discussed in detail with spouse at bedside, updated care and answered all questions. Disposition:  Status is: Inpatient Remains inpatient appropriate because: Severity of illness.     Consultants:   [facility] gastroenterology Vascular surgery Cardiology.  Procedures:   BiPAP  Antimicrobials:   None   Subjective:  Improved.  No dyspnea at rest.  Dyspnea on exertion to bathroom.  However as per patient and spouse, now able to ambulate out of bed to bathroom which he was unable to do PTA due to dyspnea.  Pain, dizziness or lightheadedness.  Objective:  Vitals:   12/28/21 0407 12/28/21 0631 12/28/21 0723 12/28/21 1124  BP: 107/71  106/62 123/62  Pulse: 80  79 89  Resp: 20  20 (!) 22  Temp: (!) 97.5 F (36.4 C)  (!) 97.5 F (36.4 C) (!) 97.4 F (36.3 C)   TempSrc: Oral  Oral Axillary  SpO2: 93%  95% 94%  Weight:  73.5 kg    Height:        General exam: Elderly male, moderately built and frail sitting up in bed comfortably without distress.   Respiratory system: Very occasional bibasilar crackles.  Otherwise clear to auscultation.  No increased work of breathing. Cardiovascular system: S1 and S2 heard, RRR.  No JVD, or pedal edema.  2/6 systolic murmur best heard at the right lower sternal edge.  Telemetry personally reviewed: Sinus rhythm with BBB morphology.  No further NSVT is noted. Gastrointestinal system: Abdomen is nondistended, soft and nontender. No organomegaly or masses felt. Normal bowel sounds heard. Central nervous system: Alert and oriented. No focal neurological deficits. Extremities: Symmetric 5 x 5 power. Skin: No rashes, lesions or ulcers Psychiatry: Judgement and insight appear normal. Mood & affect appropriate.     Data Reviewed:   I have personally reviewed following labs and imaging studies   CBC: Recent Labs  Lab 12/24/21 0645 12/25/21 0205 12/27/21 0620  WBC 11.9* 13.1* 11.5*  HGB 9.9* 10.1* 9.8*  HCT 31.7* 32.3* 31.4*  MCV 91.4 90.7 91.3  PLT 204 190 269    Basic Metabolic Panel: Recent Labs  Lab 12/24/21 0645 12/25/21 0205 12/26/21 0117 12/27/21 0620 12/28/21 0205  NA 144 142 141 139 139  K 4.1 3.5 3.7 3.8 3.9  CL 110 107 107 104 107  CO2 21* 22 22 23  21*  GLUCOSE 108* 135* 129* 116* 118*  BUN 20 21 19 20 21   CREATININE 1.44* 1.47* 1.54* 1.58* 1.53*  CALCIUM 8.4* 8.3* 8.4* 8.6* 8.5*  MG  --   --  1.9  --  2.5*    Liver Function Tests: Recent Labs  Lab 12/23/21 0530 12/25/21 0205  AST 15 12*  ALT 9 9  ALKPHOS 58 62  BILITOT 0.4 0.6  PROT 6.7 6.2*  ALBUMIN 3.0* 2.6*    CBG: Recent Labs  Lab 12/28/21 0610 12/28/21 1123 12/28/21 1604  GLUCAP 109* 123* 124*    Microbiology Studies:   Recent Results (from the past 240 hour(s))  Resp Panel by RT-PCR (Flu A&B, Covid)  Nasopharyngeal Swab     Status: None   Collection Time: 12/22/21  9:45 PM   Specimen: Nasopharyngeal Swab; Nasopharyngeal(NP) swabs in vial transport medium  Result Value Ref Range Status   SARS Coronavirus 2 by RT PCR NEGATIVE NEGATIVE Final    Comment: (NOTE) SARS-CoV-2 target nucleic acids are NOT DETECTED.  The SARS-CoV-2 RNA is generally detectable in upper respiratory specimens during the acute phase of infection. The lowest concentration of SARS-CoV-2 viral copies this assay can detect is 138 copies/mL. A negative result does not preclude SARS-Cov-2 infection and should not be used as the sole basis for treatment or other patient management decisions. A negative result may occur with  improper specimen collection/handling, submission of specimen other than nasopharyngeal swab, presence of viral mutation(s) within the areas targeted by this assay, and inadequate number of viral copies(<138 copies/mL). A negative result must be combined with clinical observations, patient history, and epidemiological information. The expected result is Negative.  Fact Sheet for Patients:  EntrepreneurPulse.com.au  Fact Sheet for  Healthcare Providers:  IncredibleEmployment.be  This test is no t yet approved or cleared by the [place] and  has been authorized for detection and/or diagnosis of SARS-CoV-2 by FDA under an Emergency Use Authorization (EUA). This EUA will remain  in effect (meaning this test can be used) for the duration of the COVID-19 declaration under Section 564(b)(1) of the Act, 21 U.S.C.section 360bbb-3(b)(1), unless the authorization is terminated  or revoked sooner.       Influenza A by PCR NEGATIVE NEGATIVE Final   Influenza B by PCR NEGATIVE NEGATIVE Final    Comment: (NOTE) The Xpert Xpress SARS-CoV-2/FLU/RSV plus assay is intended as an aid in the diagnosis of influenza from Nasopharyngeal swab specimens and should not be  used as a sole basis for treatment. Nasal washings and aspirates are unacceptable for Xpert Xpress SARS-CoV-2/FLU/RSV testing.  Fact Sheet for Patients: EntrepreneurPulse.com.au  Fact Sheet for Healthcare Providers: IncredibleEmployment.be  This test is not yet approved or cleared by the [place] FDA and has been authorized for detection and/or diagnosis of SARS-CoV-2 by FDA under an Emergency Use Authorization (EUA). This EUA will remain in effect (meaning this test can be used) for the duration of the COVID-19 declaration under Section 564(b)(1) of the Act, 21 U.S.C. section 360bbb-3(b)(1), unless the authorization is terminated or revoked.  Performed at [facility], [place] [address] [place], [place] [address]     Radiology Studies:  No results found.  Scheduled Meds:    atorvastatin  80 mg Oral Daily   dapagliflozin propanediol  10 mg Oral Daily   ezetimibe  10 mg Oral Daily   furosemide  40 mg Intravenous Q12H   guaiFENesin  600 mg Oral BID   insulin aspart  0-9 Units Subcutaneous TID WC   pantoprazole  40 mg Oral Daily   sacubitril-valsartan  1 tablet Oral BID   sodium chloride flush  3 mL Intravenous Q12H   spironolactone  12.5 mg Oral Daily    Continuous Infusions:       LOS: 4 days     Vernell Leep, MD,  FACP, [facility], [facility], [facility] (Care Management Physician Certified) [place]  To contact the attending provider between 7A-7P or the covering provider during after hours 7P-7A, please log into the web site [email or web address] and access using universal [place] password for that web site. If you do not have the password, please call the hospital operator.  12/28/2021, 4:28 PM

## 2021-12-29 DIAGNOSIS — L8990 Pressure ulcer of unspecified site, unspecified stage: Secondary | ICD-10-CM

## 2021-12-29 DIAGNOSIS — E440 Moderate protein-calorie malnutrition: Secondary | ICD-10-CM

## 2021-12-29 DIAGNOSIS — D649 Anemia, unspecified: Secondary | ICD-10-CM

## 2021-12-29 DIAGNOSIS — J90 Pleural effusion, not elsewhere classified: Secondary | ICD-10-CM

## 2021-12-29 LAB — CBC
HCT: 31.1 % — ABNORMAL LOW (ref 39.0–52.0)
Hemoglobin: 10 g/dL — ABNORMAL LOW (ref 13.0–17.0)
MCH: 28.7 pg (ref 26.0–34.0)
MCHC: 32.2 g/dL (ref 30.0–36.0)
MCV: 89.4 fL (ref 80.0–100.0)
Platelets: 155 10*3/uL (ref 150–400)
RBC: 3.48 MIL/uL — ABNORMAL LOW (ref 4.22–5.81)
RDW: 15.1 % (ref 11.5–15.5)
WBC: 12.7 10*3/uL — ABNORMAL HIGH (ref 4.0–10.5)
nRBC: 0 % (ref 0.0–0.2)

## 2021-12-29 LAB — BASIC METABOLIC PANEL
Anion gap: 13 (ref 5–15)
BUN: 22 mg/dL (ref 8–23)
CO2: 19 mmol/L — ABNORMAL LOW (ref 22–32)
Calcium: 8.3 mg/dL — ABNORMAL LOW (ref 8.9–10.3)
Chloride: 106 mmol/L (ref 98–111)
Creatinine, Ser: 1.53 mg/dL — ABNORMAL HIGH (ref 0.61–1.24)
GFR, Estimated: 49 mL/min — ABNORMAL LOW (ref >60)
Glucose, Bld: 111 mg/dL — ABNORMAL HIGH (ref 70–99)
Potassium: 3.5 mmol/L (ref 3.5–5.1)
Sodium: 138 mmol/L (ref 135–145)

## 2021-12-29 LAB — GLUCOSE, CAPILLARY
Glucose-Capillary: 103 mg/dL — ABNORMAL HIGH (ref 70–99)
Glucose-Capillary: 119 mg/dL — ABNORMAL HIGH (ref 70–99)
Glucose-Capillary: 174 mg/dL — ABNORMAL HIGH (ref 70–99)
Glucose-Capillary: 116 mg/dL — ABNORMAL HIGH (ref 70–99)

## 2021-12-29 MED ORDER — FUROSEMIDE 40 MG PO TABS
80.0000 mg | ORAL_TABLET | Freq: Every day | ORAL | Status: DC
  Administered 2021-12-30 – 2022-01-01 (×3): 80 mg via ORAL
  Filled 2021-12-29 (×3): qty 2

## 2021-12-29 MED ORDER — PEG 3350-KCL-NA BICARB-NACL 420 G PO SOLR
4000.0000 mL | Freq: Once | ORAL | Status: AC
  Administered 2021-12-30: 4000 mL via ORAL
  Filled 2021-12-29: qty 4000

## 2021-12-29 MED ORDER — BOOST PLUS PO LIQD
237.0000 mL | Freq: Two times a day (BID) | ORAL | Status: DC
  Administered 2021-12-29 – 2022-01-01 (×4): 237 mL via ORAL
  Filled 2021-12-29 (×8): qty 237

## 2021-12-29 MED ORDER — POTASSIUM CHLORIDE CRYS ER 20 MEQ PO TBCR
40.0000 meq | EXTENDED_RELEASE_TABLET | Freq: Once | ORAL | Status: AC
  Administered 2021-12-29: 40 meq via ORAL
  Filled 2021-12-29: qty 2

## 2021-12-29 MED ORDER — ADULT MULTIVITAMIN W/MINERALS CH
1.0000 | ORAL_TABLET | Freq: Every day | ORAL | Status: DC
  Administered 2021-12-29 – 2022-01-01 (×4): 1 via ORAL
  Filled 2021-12-29 (×4): qty 1

## 2021-12-29 MED ORDER — METOPROLOL SUCCINATE ER 25 MG PO TB24
12.5000 mg | ORAL_TABLET | Freq: Every day | ORAL | Status: DC
  Administered 2021-12-29 – 2021-12-31 (×3): 12.5 mg via ORAL
  Filled 2021-12-29 (×4): qty 1

## 2021-12-29 NOTE — Plan of Care (Signed)
°  Problem: Health Behavior/Discharge Planning: Goal: Ability to manage health-related needs will improve Outcome: Progressing   Problem: Clinical Measurements: Goal: Will remain free from infection Outcome: Progressing   Problem: Activity: Goal: Risk for activity intolerance will decrease Outcome: Progressing   Problem: Nutrition: Goal: Adequate nutrition will be maintained Outcome: Progressing   

## 2021-12-29 NOTE — Progress Notes (Addendum)
Progress Note  Patient Name: Bob Johnson Date of Encounter: 12/29/2021  [facility] Cardiologist: Kirk Ruths, MD ([facility])  Subjective   Resting in bed. He walked twice yesterday in the hall - the second walk he did not require oxygen. He feels his breathing continues to improve.   Inpatient Medications    Scheduled Meds:  atorvastatin  80 mg Oral Daily   dapagliflozin propanediol  10 mg Oral Daily   ezetimibe  10 mg Oral Daily   furosemide  40 mg Intravenous Q12H   guaiFENesin  600 mg Oral BID   insulin aspart  0-9 Units Subcutaneous TID WC   pantoprazole  40 mg Oral Daily   sacubitril-valsartan  1 tablet Oral BID   sodium chloride flush  3 mL Intravenous Q12H   spironolactone  12.5 mg Oral Daily   Continuous Infusions:  PRN Meds: acetaminophen **OR** acetaminophen, ipratropium-albuterol   Vital Signs    Vitals:   12/28/21 2336 12/29/21 0353 12/29/21 0424 12/29/21 0808  BP: 104/61 102/61  (!) 101/57  Pulse:  80  87  Resp: 20 20  20   Temp: 97.6 F (36.4 C) 97.7 F (36.5 C)  97.6 F (36.4 C)  TempSrc: Oral Oral  Oral  SpO2: 94% 95%  93%  Weight:   72.3 kg   Height:        Intake/Output Summary (Last 24 hours) at 12/29/2021 0904 Last data filed at 12/29/2021 0422 Gross per 24 hour  Intake 318 ml  Output 1100 ml  Net -782 ml   Last 3 Weights 12/29/2021 12/28/2021 12/27/2021  Weight (lbs) 159 lb 8 oz 162 lb 166 lb 3.6 oz  Weight (kg) 72.349 kg 73.483 kg 75.4 kg      Telemetry    Sinus rhythm with PVCs - Personally Reviewed  ECG    No new tracings - Personally Reviewed  Physical Exam   GEN: No acute distress.   Neck: No JVD Cardiac: RRR, no murmurs, rubs, or gallops.  Respiratory: coarse sounds in bases GI: Soft, nontender, non-distended  MS: No edema; No deformity. Neuro:  Nonfocal  Psych: Normal affect   Labs    High Sensitivity Troponin:   Recent Labs  Lab 12/24/21 1300 12/24/21 1452  TROPONINIHS 270* 302*     Chemistry Recent  Labs  Lab 12/23/21 0530 12/24/21 0645 12/25/21 0205 12/26/21 0117 12/27/21 0620 12/28/21 0205 12/29/21 0147  NA 142   < > 142 141 139 139 138  K 3.4*   < > 3.5 3.7 3.8 3.9 3.5  CL 107   < > 107 107 104 107 106  CO2 23   < > 22 22 23  21* 19*  GLUCOSE 107*   < > 135* 129* 116* 118* 111*  BUN 26*   < > 21 19 20 21 22   CREATININE 1.24   < > 1.47* 1.54* 1.58* 1.53* 1.53*  CALCIUM 8.6*   < > 8.3* 8.4* 8.6* 8.5* 8.3*  MG  --   --   --  1.9  --  2.5*  --   PROT 6.7  --  6.2*  --   --   --   --   ALBUMIN 3.0*  --  2.6*  --   --   --   --   AST 15  --  12*  --   --   --   --   ALT 9  --  9  --   --   --   --  ALKPHOS 58  --  62  --   --   --   --   BILITOT 0.4  --  0.6  --   --   --   --   GFRNONAA >60   < > 51* 48* 47* 49* 49*  ANIONGAP 12   < > 13 12 12 11 13    < > = values in this interval not displayed.    Lipids No results for input(s): CHOL, TRIG, HDL, LABVLDL, LDLCALC, CHOLHDL in the last 168 hours.  Hematology Recent Labs  Lab 12/25/21 0205 12/27/21 0620 12/29/21 0147  WBC 13.1* 11.5* 12.7*  RBC 3.56* 3.44* 3.48*  HGB 10.1* 9.8* 10.0*  HCT 32.3* 31.4* 31.1*  MCV 90.7 91.3 89.4  MCH 28.4 28.5 28.7  MCHC 31.3 31.2 32.2  RDW 15.7* 15.4 15.1  PLT 190 163 155   Thyroid No results for input(s): TSH, FREET4 in the last 168 hours.  BNPNo results for input(s): BNP, PROBNP in the last 168 hours.  DDimer No results for input(s): DDIMER in the last 168 hours.   Radiology    No results found.  Cardiac Studies   Echo 12/24/21: 1. Left ventricular ejection fraction, by estimation, is 20 to 25%. The  left ventricle has severely decreased function. The left ventricle  demonstrates global hypokinesis. The left ventricular internal cavity size  was mildly dilated. Left ventricular  diastolic parameters are consistent with Grade II diastolic dysfunction  (pseudonormalization). Elevated left atrial pressure. Although there is  global hypokinesis, there is near akinesis and  thinning of the myocardium  in the inferior wall, inferior  septum, anterior septum and apex, suggesting infarction in the right  coronary artery and LAD artery territories.   2. Right ventricular systolic function is normal. The right ventricular  size is normal. There is moderately elevated pulmonary artery systolic  pressure.   3. Left atrial size was severely dilated.   4. Right atrial size was mildly dilated.   5. A small pericardial effusion is present. The pericardial effusion is  circumferential. There is no evidence of cardiac tamponade.   6. The mitral valve is normal in structure. Trivial mitral valve  regurgitation.   7. Tricuspid valve regurgitation is moderate.   8. The aortic valve is tricuspid. Aortic valve regurgitation is not  visualized. No aortic stenosis is present.   9. The inferior vena cava is dilated in size with >50% respiratory  variability, suggesting right atrial pressure of 8 mmHg.  Patient Profile     71 y.o. 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  Assessment & Plan    Acute on chronic systolic and diastolic heart failure Ischemic cardiomyopathy CKD stage III - LVEF 20-25% with grade 2 DD - LVEF down from 40% ([facility]) - exacerbation felt due to anemia and volume with transfusion - continues to diurese on 40 mg IV lasix BID, farxiga, 24-26 mg entresto BID, and 12.5 mg spironolactone - he diuresed 1.1 L urine output yesterday - weight continues to drop, now 159.5 - 10 lbs down from admission - renal function stable  Per Dr. Sallyanne Kuster, optimized  for endoscopy with GI.  Anemia - per GI - restarted ASA - Hb stable - pt was not yet ready for prep   CAD - no angina - hs troponin 270 --> 302 - suspect demand ischemia in the setting of anemia and CHF - GI has Ok'ed restarting ASA   AAA - 3.8 cm on CT at [facility] 2021 - follow up will be arranged with [facility] in 6 months - asymptomatic      For questions or updates, please contact [place] Please consult [email or web address] for contact info under        Signed, Ledora Bottcher, PA  12/29/2021, 9:04 AM    I have seen and examined the patient along with Ledora Bottcher, PA .  I have reviewed the chart, notes and new data.  I agree with PA/NP's note.   PLAN: He is optimized for endoscopy from HF point of view. Switch to PO diuretics. BP low normal. Will use metoprolol succinate rather than carvedilol and start at a lower dose than he was previously taking, titrate up gradually after DC.  Sanda Klein, MD, [place] [phone] 12/29/2021, 12:11 PM

## 2021-12-29 NOTE — Progress Notes (Signed)
PROGRESS NOTE  Bob Johnson JOI:[id number] DOB: Dec 26, 1950   PCP: Fransisca Connors, FNP  Patient is from: Home. Followed with cardiology at [place]: 12/22/2021 LOS: 5  Chief complaints:  Chief Complaint  Patient presents with   Abnormal Labs      Brief Narrative / Interim history: 71 year old M with PMH of CAD s/p BMS to LAD in 4709, ICM/systolic CHF, PAD, DM-2, HTN, HLD, penetrating atherosclerotic ulcer of aorta and former smoker presented to PCP on 2/8 with dyspnea and DOE for 2 weeks, decreased appetite and 19 pounds weight loss since 09/2021 and found to be anemic with Hgb of 7.2.  He was directed to ED, and admitted for symptomatic anemia.  Hemoccult positive.  CTA chest/abdomen/pelvis concerning for impending AAA rupture per radiology.  Patient was transferred from [facility] to [facility].  Vascular surgery reviewed the CTA and not concerned.   Patient developed acute respiratory distress due to decompensated CHF requiring IV Lasix and BiPAP.  Cardiology consulted, and continued on IV Lasix.  [facility] consulted for GI bleed.  Initially, patient and family wanted to wait until respiratory status improves.  Patient is now cleared for EGD/colonoscopy by cardiology, which are planned for 2/17.      Subjective: Seen and examined earlier this morning.  No major events overnight of this morning.  No complaints.  Reports improvement in his breathing.  Still with DOE, orthopnea and PND.  Also reports productive cough with whitish phlegm.  Denies melena or hematochezia.  Objective: Vitals:   12/29/21 0353 12/29/21 0424 12/29/21 0808 12/29/21 1440  BP: 102/61  (!) 101/57 101/74  Pulse: 80  87 94  Resp: 20  20 20   Temp: 97.7 F (36.5 C)  97.6 F (36.4 C) (!) 97.4 F (36.3 C)  TempSrc: Oral  Oral Oral  SpO2: 95%  93% 92%  Weight:  72.3 kg    Height:        Examination:  GENERAL: No apparent distress.  Nontoxic. HEENT: MMM.  Vision and hearing grossly intact.  NECK: Supple.  No  apparent JVD.  RESP: 95% on 1 L.  No IWOB.  Fair aeration bilaterally. CVS:  RRR. Heart sounds normal.  ABD/GI/GU: BS+. Abd soft, NTND.  MSK/EXT:  Moves extremities. No apparent deformity. No edema.  SKIN: no apparent skin lesion or wound NEURO: Awake, alert and oriented appropriately.  No apparent focal neuro deficit. PSYCH: Calm. Normal affect.   Procedures:  None  Microbiology summarized: [id number] and influenza PCR nonreactive.  Assessment and Plan: * Acute on chronic systolic CHF (congestive heart failure) ([place])- (present on admission) TTE with LVEF of 20 to 25% (previously 40%), global hypokinesis, G2 DD.  Could be exacerbated by anemia and blood transfusion.  Started on IV Lasix with improvement in his respiratory symptoms and fluid status.  Had 1.1 L UOP/24 hours.  Net -4 L charted. -Cardiology managing-remains on IV Lasix 40 mg twice daily -GDMT-Toprol-XL, Entresto, Farxiga, and Aldactone -Closely monitor intake and output and daily weight. -Monitor renal functions and electrolytes.  Acute respiratory failure with hypoxia (HCC) Likely due to the above.  Resolving.   -Manage CHF as above  -Wean oxygen as able    Symptomatic iron deficiency anemia likely due to chronic GI bleed- (present on admission) Recent Labs    12/22/21 1941 12/23/21 0530 12/24/21 0645 12/25/21 0205 12/27/21 0620 12/29/21 0147  HGB 7.1* 8.4* 9.9* 10.1* 9.8* 10.0*  -Hgb 13.3 in 02/2021.  Hemoccult positive.  Anemia panel suggests iron deficiency. -  Transfused 2 units -[facility] following-plan for EGD/[place] on 2/17 -Continue holding aspirin -Continue PPI  Chronic kidney disease, stage 3a ([place])- (present on admission) Recent Labs    12/22/21 1941 12/23/21 0530 12/24/21 0645 12/25/21 0205 12/26/21 0117 12/27/21 0620 12/28/21 0205 12/29/21 0147  BUN 29* 26* 20 21 19 20 21 22   CREATININE 1.40* 1.24 1.44* 1.47* 1.54* 1.58* 1.53* 1.53*  -Continue monitoring.  Aneurysm of infrarenal abdominal  aorta- (present on admission) CT report was read as impending AAA rupture.  CT reviewed by vascular surgery.  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  He is not concerned about aortitis.  No surgical intervention indicated. -Outpatient follow-up in 6 months  Type 2 diabetes mellitus without complication, without long-term current use of insulin (HCC) A1c 5.6%. Recent Labs  Lab 12/28/21 1123 12/28/21 1604 12/28/21 2114 12/29/21 0652 12/29/21 1141  GLUCAP 123* 124* 133* 103* 119*  -Continue current insulin regimen -Continue holding home metformin  Bilateral pleural effusion- (present on admission) Likely due to CHF. Continue IV Lasix.  Hypokalemia- (present on admission) K3.5. -P.o. KCl 40x1  PAD (peripheral artery disease) ([place])- (present on admission) -Continue statins and Zetia. -Aspirin on hold due to FOBT + and anemia.  Mixed hyperlipidemia- (present on admission) -Continue with Lipitor and Zetia.  Essential hypertension- (present on admission) Cardiac meds as above.  CAD (coronary artery disease)- (present on admission) Elevated troponin likely demand ischemia. Mild CHF and anemia as above.         Body mass index is 21.57 kg/m. Nutrition Problem: Moderate Malnutrition Etiology: chronic illness (CHF, CAD) Signs/Symptoms: moderate fat depletion, moderate muscle depletion Interventions: Boost Plus, MVI Pressure Injury 12/23/21 Buttocks Stage 2 -  Partial thickness loss of dermis presenting as a shallow open injury with a red, pink wound bed without slough. areas of broken skin on inside of buttocks on right side. (Active)  12/23/21   Location: Buttocks  Location Orientation:   Staging: Stage 2 -  Partial thickness loss of dermis presenting as a shallow open injury with a red, pink wound bed without slough.  Wound Description (Comments): areas of broken skin on inside of buttocks on right side.  Present on Admission:    DVT  prophylaxis:  SCDs Start: 12/22/21 2125  Code Status: Full code Family Communication: Updated patient's wife at bedside. Level of care: Progressive Status is: Inpatient Remains inpatient appropriate because: Due to acute on chronic combined CHF, symptomatic iron deficiency anemia     Final disposition: Likely home once medically stable and cleared.      Consultants:  Gastroenterology Cardiology   Sch Meds:  Scheduled Meds:  atorvastatin  80 mg Oral Daily   dapagliflozin propanediol  10 mg Oral Daily   ezetimibe  10 mg Oral Daily   [START ON 12/30/2021] furosemide  80 mg Oral Daily   guaiFENesin  600 mg Oral BID   insulin aspart  0-9 Units Subcutaneous TID WC   lactose free nutrition  237 mL Oral BID BM   metoprolol succinate  12.5 mg Oral Daily   multivitamin with minerals  1 tablet Oral Daily   pantoprazole  40 mg Oral Daily   [START ON 12/30/2021] polyethylene glycol-electrolytes  4,000 mL Oral Once   sacubitril-valsartan  1 tablet Oral BID   sodium chloride flush  3 mL Intravenous Q12H   spironolactone  12.5 mg Oral Daily   Continuous Infusions: PRN Meds:.acetaminophen **OR** acetaminophen, ipratropium-albuterol  Antimicrobials: Anti-infectives (From admission, onward)  None        I have personally reviewed the following labs and images: CBC: Recent Labs  Lab 12/23/21 0530 12/24/21 0645 12/25/21 0205 12/27/21 0620 12/29/21 0147  WBC 10.1 11.9* 13.1* 11.5* 12.7*  HGB 8.4* 9.9* 10.1* 9.8* 10.0*  HCT 26.5* 31.7* 32.3* 31.4* 31.1*  MCV 89.8 91.4 90.7 91.3 89.4  PLT 206 204 190 163 155   BMP &GFR Recent Labs  Lab 12/25/21 0205 12/26/21 0117 12/27/21 0620 12/28/21 0205 12/29/21 0147  NA 142 141 139 139 138  K 3.5 3.7 3.8 3.9 3.5  CL 107 107 104 107 106  CO2 22 22 23  21* 19*  GLUCOSE 135* 129* 116* 118* 111*  BUN 21 19 20 21 22   CREATININE 1.47* 1.54* 1.58* 1.53* 1.53*  CALCIUM 8.3* 8.4* 8.6* 8.5* 8.3*  MG  --  1.9  --  2.5*  --     Estimated Creatinine Clearance: 45.9 mL/min (A) (by C-G formula based on SCr of 1.53 mg/dL (H)). Liver & Pancreas: Recent Labs  Lab 12/23/21 0530 12/25/21 0205  AST 15 12*  ALT 9 9  ALKPHOS 58 62  BILITOT 0.4 0.6  PROT 6.7 6.2*  ALBUMIN 3.0* 2.6*   No results for input(s): LIPASE, AMYLASE in the last 168 hours. No results for input(s): AMMONIA in the last 168 hours. Diabetic: No results for input(s): HGBA1C in the last 72 hours. Recent Labs  Lab 12/28/21 1123 12/28/21 1604 12/28/21 2114 12/29/21 0652 12/29/21 1141  GLUCAP 123* 124* 133* 103* 119*   Cardiac Enzymes: No results for input(s): CKTOTAL, CKMB, CKMBINDEX, TROPONINI in the last 168 hours. No results for input(s): PROBNP in the last 8760 hours. Coagulation Profile: No results for input(s): INR, PROTIME in the last 168 hours. Thyroid Function Tests: No results for input(s): TSH, T4TOTAL, FREET4, T3FREE, THYROIDAB in the last 72 hours. Lipid Profile: No results for input(s): CHOL, HDL, LDLCALC, TRIG, CHOLHDL, LDLDIRECT in the last 72 hours. Anemia Panel: No results for input(s): VITAMINB12, FOLATE, FERRITIN, TIBC, IRON, RETICCTPCT in the last 72 hours. Urine analysis: No results found for: COLORURINE, APPEARANCEUR, LABSPEC, PHURINE, GLUCOSEU, HGBUR, BILIRUBINUR, KETONESUR, PROTEINUR, UROBILINOGEN, NITRITE, LEUKOCYTESUR Sepsis Labs: Invalid input(s): PROCALCITONIN, [place]  Microbiology: Recent Results (from the past 240 hour(s))  Resp Panel by RT-PCR (Flu A&B, Covid) Nasopharyngeal Swab     Status: None   Collection Time: 12/22/21  9:45 PM   Specimen: Nasopharyngeal Swab; Nasopharyngeal(NP) swabs in vial transport medium  Result Value Ref Range Status   SARS Coronavirus 2 by RT PCR NEGATIVE NEGATIVE Final    Comment: (NOTE) SARS-CoV-2 target nucleic acids are NOT DETECTED.  The SARS-CoV-2 RNA is generally detectable in upper respiratory specimens during the acute phase of infection. The  lowest concentration of SARS-CoV-2 viral copies this assay can detect is 138 copies/mL. A negative result does not preclude SARS-Cov-2 infection and should not be used as the sole basis for treatment or other patient management decisions. A negative result may occur with  improper specimen collection/handling, submission of specimen other than nasopharyngeal swab, presence of viral mutation(s) within the areas targeted by this assay, and inadequate number of viral copies(<138 copies/mL). A negative result must be combined with clinical observations, patient history, and epidemiological information. The expected result is Negative.  Fact Sheet for Patients:  EntrepreneurPulse.com.au  Fact Sheet for Healthcare Providers:  IncredibleEmployment.be  This test is no t yet approved or cleared by the [place] FDA and  has been authorized for detection and/or diagnosis  of SARS-CoV-2 by FDA under an Emergency Use Authorization (EUA). This EUA will remain  in effect (meaning this test can be used) for the duration of the COVID-19 declaration under Section 564(b)(1) of the Act, 21 U.S.C.section 360bbb-3(b)(1), unless the authorization is terminated  or revoked sooner.       Influenza A by PCR NEGATIVE NEGATIVE Final   Influenza B by PCR NEGATIVE NEGATIVE Final    Comment: (NOTE) The Xpert Xpress SARS-CoV-2/FLU/RSV plus assay is intended as an aid in the diagnosis of influenza from Nasopharyngeal swab specimens and should not be used as a sole basis for treatment. Nasal washings and aspirates are unacceptable for Xpert Xpress SARS-CoV-2/FLU/RSV testing.  Fact Sheet for Patients: EntrepreneurPulse.com.au  Fact Sheet for Healthcare Providers: IncredibleEmployment.be  This test is not yet approved or cleared by the [place] FDA and has been authorized for detection and/or diagnosis of SARS-CoV-2 by FDA under  an Emergency Use Authorization (EUA). This EUA will remain in effect (meaning this test can be used) for the duration of the COVID-19 declaration under Section 564(b)(1) of the Act, 21 U.S.C. section 360bbb-3(b)(1), unless the authorization is terminated or revoked.  Performed at [facility], [place] [address], [place],  [address]     Radiology Studies: No results found.    Corry Ihnen T. [place]  If 7PM-7AM, please contact night-coverage [email or web address] 12/29/2021, 3:53 PM

## 2021-12-29 NOTE — Progress Notes (Signed)
[facility] Gastroenterology Progress Note  Bob Johnson 71 y.o. Aug 01, 1951  CC:  Anemia, heme-positive stool   Subjective: Patient resting comfortably in bed.  He states he is feeling better.  Shortness of breath has improved, he is no longer on oxygen supplementation.  Denies nausea, vomiting, fever, chills, abdominal pain.  He is having regular bowel movements, denies melena or hematochezia.  ROS : Review of Systems  Constitutional:  Negative for chills and fever.  Respiratory:  Negative for shortness of breath.   Cardiovascular:  Negative for chest pain.  Gastrointestinal:  Negative for abdominal pain, blood in stool, constipation, diarrhea, heartburn, melena, nausea and vomiting.     Objective: Vital signs in last 24 hours: Vitals:   12/29/21 0353 12/29/21 0808  BP: 102/61 (!) 101/57  Pulse: 80 87  Resp: 20 20  Temp: 97.7 F (36.5 C) 97.6 F (36.4 C)  SpO2: 95% 93%    Physical Exam:  General:  Alert, cooperative, no distress, appears stated age  Head:  Normocephalic, without obvious abnormality, atraumatic  Eyes:  EOM's intact  Lungs:   Occasional basilar crackles, otherwise clear to auscultation, respirations unlabored  Heart:  Regular rate and rhythm, S1, S2 normal  Abdomen:   Soft, non-tender, bowel sounds active all four quadrants,  no masses,   Extremities: Extremities normal, atraumatic, no  edema       Lab Results: Recent Labs    12/28/21 0205 12/29/21 0147  NA 139 138  K 3.9 3.5  CL 107 106  CO2 21* 19*  GLUCOSE 118* 111*  BUN 21 22  CREATININE 1.53* 1.53*  CALCIUM 8.5* 8.3*  MG 2.5*  --    No results for input(s): AST, ALT, ALKPHOS, BILITOT, PROT, ALBUMIN in the last 72 hours. Recent Labs    12/27/21 0620 12/29/21 0147  WBC 11.5* 12.7*  HGB 9.8* 10.0*  HCT 31.4* 31.1*  MCV 91.3 89.4  PLT 163 155   No results for input(s): LABPROT, INR in the last 72 hours.    Assessment Symptomatic anemia with heme positive stool - No overt bleeding -  Hgb 10.0, Hct 31.1 - BUN 22, Cr 1.53  Acute on chronic systolic and diastolic heart failure, ischemic cardiomyopathy - LVEF 20-25% - Stable renal function - Optimized for endoscopy per cardiology  AAA - 3.8 cm on CT at [facility] 2021 - Asymptomatic  Plan: Plan for EGD/Colonoscopy Friday 2/17. I thoroughly discussed the procedures to include nature, alternatives, benefits, and risks including but not limited to bleeding, perforation, infection, anesthesia/cardiac and pulmonary complications. Patient provides understanding and gave verbal consent to proceed. Continue Protonix 40 mg IV BID. Nulytely prep and clear liquid diet tomorrow, NPO at midnight 2/17. Continue daily CBC with transfusion as needed to maintain Hgb >7.  [facility] will follow.    Angelique Holm PA-C 12/29/2021, 1:59 PM  Contact #  [phone]

## 2021-12-29 NOTE — Progress Notes (Signed)
Nutrition Follow-up  DOCUMENTATION CODES:   Non-severe (moderate) malnutrition in context of chronic illness  INTERVENTION:   Boost Plus PO BID, each supplement provides 360 kcal and 14 gm protein  MVI with minerals daily  NUTRITION DIAGNOSIS:   Moderate Malnutrition related to chronic illness (CHF, CAD) as evidenced by moderate fat depletion, moderate muscle depletion.  GOAL:   Patient will meet greater than or equal to 90% of their needs  MONITOR:   PO intake, Supplement acceptance  REASON FOR ASSESSMENT:   Malnutrition Screening Tool    ASSESSMENT:   71 yo male admitted with GI bleed. PMH includes HTN, HLD, PAD, CAD, DM, aortic ulcer, CHF.  No plans for AAA repair at this time. Cardiology has cleared patient for EGD, but patient declined EGD.  Now on a heart healthy carb modified diet with 1500 ml fluid restriction. Meal intakes: 100% for the past 2-3 days  Labs reviewed.  CBG: 103  Medications reviewed and include Farxiga, Lasix, Novolog, Protonix, Aldactone.   Admission weight 76.2 kg Current weight 72.3 kg Decline in weight r/t negative fluid balance.  Patient reports 40 lb weight loss over the past month. Likely r/t fluids as well as poor intake at home. He has been having trouble breathing for the past few weeks and this has caused decreased appetite and intake. Since admission his appetite and breathing have improved and he has been eating better. He agreed to try Boost Plus supplements BID to help maximize oral intake of protein and calories to promote repletion of nutrient stores.   NUTRITION - FOCUSED PHYSICAL EXAM:  Flowsheet Row Most Recent Value  Orbital Region Mild depletion  Upper Arm Region Moderate depletion  Thoracic and Lumbar Region Moderate depletion  Buccal Region Moderate depletion  Temple Region Moderate depletion  Clavicle Bone Region Moderate depletion  Clavicle and Acromion Bone Region Moderate depletion  Scapular Bone Region  Mild depletion  Dorsal Hand Mild depletion  Patellar Region Moderate depletion  Anterior Thigh Region Moderate depletion  Posterior Calf Region Moderate depletion  Edema (RD Assessment) None  Hair Reviewed  Eyes Reviewed  Mouth Reviewed  Skin Reviewed  Nails Reviewed       Diet Order:   Diet Order             Diet heart healthy/carb modified Room service appropriate? Yes; Fluid consistency: Thin; Fluid restriction: 1500 mL Fluid  Diet effective now                   EDUCATION NEEDS:   No education needs have been identified at this time  Skin:  Skin Assessment: Reviewed RN Assessment  Last BM:  2/14 type 4  Height:   Ht Readings from Last 1 Encounters:  12/27/21 6' 0.1" (1.831 m)    Weight:   Wt Readings from Last 1 Encounters:  12/29/21 72.3 kg    BMI:  Body mass index is 21.57 kg/m.  Estimated Nutritional Needs:   Kcal:  2100-2300  Protein:  100-115 gm  Fluid:  >/= 2.2 L    Lucas Mallow RD, LDN, CNSC Please refer to Amion for contact information.

## 2021-12-30 DIAGNOSIS — M25561 Pain in right knee: Secondary | ICD-10-CM

## 2021-12-30 LAB — GLUCOSE, CAPILLARY
Glucose-Capillary: 119 mg/dL — ABNORMAL HIGH (ref 70–99)
Glucose-Capillary: 144 mg/dL — ABNORMAL HIGH (ref 70–99)
Glucose-Capillary: 160 mg/dL — ABNORMAL HIGH (ref 70–99)
Glucose-Capillary: 134 mg/dL — ABNORMAL HIGH (ref 70–99)

## 2021-12-30 LAB — CBC
HCT: 30.8 % — ABNORMAL LOW (ref 39.0–52.0)
Hemoglobin: 9.5 g/dL — ABNORMAL LOW (ref 13.0–17.0)
MCH: 27.5 pg (ref 26.0–34.0)
MCHC: 30.8 g/dL (ref 30.0–36.0)
MCV: 89 fL (ref 80.0–100.0)
Platelets: 149 10*3/uL — ABNORMAL LOW (ref 150–400)
RBC: 3.46 MIL/uL — ABNORMAL LOW (ref 4.22–5.81)
RDW: 15.2 % (ref 11.5–15.5)
WBC: 12.9 10*3/uL — ABNORMAL HIGH (ref 4.0–10.5)
nRBC: 0 % (ref 0.0–0.2)

## 2021-12-30 LAB — RENAL FUNCTION PANEL
Albumin: 2.5 g/dL — ABNORMAL LOW (ref 3.5–5.0)
Anion gap: 12 (ref 5–15)
BUN: 23 mg/dL (ref 8–23)
CO2: 20 mmol/L — ABNORMAL LOW (ref 22–32)
Calcium: 8.5 mg/dL — ABNORMAL LOW (ref 8.9–10.3)
Chloride: 104 mmol/L (ref 98–111)
Creatinine, Ser: 1.52 mg/dL — ABNORMAL HIGH (ref 0.61–1.24)
GFR, Estimated: 49 mL/min — ABNORMAL LOW (ref >60)
Glucose, Bld: 126 mg/dL — ABNORMAL HIGH (ref 70–99)
Phosphorus: 3.2 mg/dL (ref 2.5–4.6)
Potassium: 3.6 mmol/L (ref 3.5–5.1)
Sodium: 136 mmol/L (ref 135–145)

## 2021-12-30 LAB — MAGNESIUM: Magnesium: 2.4 mg/dL (ref 1.7–2.4)

## 2021-12-30 MED ORDER — POTASSIUM CHLORIDE CRYS ER 20 MEQ PO TBCR
40.0000 meq | EXTENDED_RELEASE_TABLET | Freq: Once | ORAL | Status: AC
Start: 2021-12-30 — End: 2021-12-30
  Administered 2021-12-30: 40 meq via ORAL
  Filled 2021-12-30: qty 2

## 2021-12-30 MED ORDER — DICLOFENAC SODIUM 1 % EX GEL
2.0000 g | Freq: Four times a day (QID) | CUTANEOUS | Status: DC
  Administered 2021-12-30 – 2022-01-01 (×7): 2 g via TOPICAL
  Filled 2021-12-30: qty 100

## 2021-12-30 MED ORDER — DAPAGLIFLOZIN PROPANEDIOL 10 MG PO TABS
10.0000 mg | ORAL_TABLET | Freq: Every day | ORAL | Status: DC
  Administered 2021-12-31 – 2022-01-01 (×2): 10 mg via ORAL
  Filled 2021-12-30 (×2): qty 1

## 2021-12-30 NOTE — Progress Notes (Signed)
[facility] Progress Note  [facility] 71 y.o. 29-Apr-1951  CC: Anemia, heme positive stool.   Subjective: Patient seen and examined at bedside.  He is feeling better .  Not on oxygen supplements at this time.  Denies any GI symptoms  ROS :   Negative for abdominal pain and fever   Objective: Vital signs in last 24 hours: Vitals:   12/30/21 0726 12/30/21 0900  BP:  (!) 108/52  Pulse: 87 84  Resp: 20 20  Temp:  (!) 97.4 F (36.3 C)  SpO2: 98% 93%    Physical Exam:  General:  Alert, cooperative, no distress, appears stated age.  Oxygen by nasal cannula  Head:  Normocephalic, without obvious abnormality, atraumatic  Eyes:  , EOM's intact,   Lungs:   Basilar crackles noted  Heart:  Regular rate and rhythm, S1, S2 normal  Abdomen:   Soft, non-tender, nondistended, bowel sounds present.  No peritoneal signs  Extremities: Extremities normal, atraumatic, no  edema  Psych Mood and affect normal     Lab Results: Recent Labs    12/28/21 0205 12/29/21 0147 12/30/21 0235  NA 139 138 136  K 3.9 3.5 3.6  CL 107 106 104  CO2 21* 19* 20*  GLUCOSE 118* 111* 126*  BUN 21 22 23   CREATININE 1.53* 1.53* 1.52*  CALCIUM 8.5* 8.3* 8.5*  MG 2.5*  --  2.4  PHOS  --   --  3.2   Recent Labs    12/30/21 0235  ALBUMIN 2.5*    Recent Labs    12/29/21 0147 12/30/21 0235  WBC 12.7* 12.9*  HGB 10.0* 9.5*  HCT 31.1* 30.8*  MCV 89.4 89.0  PLT 155 149*   No results for input(s): LABPROT, INR in the last 72 hours.    Assessment/Plan: -Symptomatic anemia with heme positive stool.  No overt bleeding -Ischemic cardiomyopathy.  EF has dropped to 20 to 25%.  Feeling better now.  Not on oxygen supplement at this time  Recommendations ------------------------- -Plan for EGD and colonoscopy tomorrow. -Clear liquid diet today.  Keep n.p.o. past midnight.  Start colonoscopy prep this afternoon.  Risks (bleeding, infection, bowel perforation that could require surgery,  sedation-related changes in cardiopulmonary systems), benefits (identification and possible treatment of source of symptoms, exclusion of certain causes of symptoms), and alternatives (watchful waiting, radiographic imaging studies, empiric medical treatment)  were explained to patient/family in detail and patient wishes to proceed.    [name] MD, [place] 12/30/2021, 9:28 AM  Contact #  [phone]

## 2021-12-30 NOTE — Assessment & Plan Note (Addendum)
Likely osteoarthritis.  Exam reassuring. -Voltaren gel and Tylenol as needed -Advised to avoid over-the-counter p.o. NSAID

## 2021-12-30 NOTE — Progress Notes (Addendum)
Progress Note  Patient Name: Bob Johnson Date of Encounter: 12/30/2021  Primary Cardiologist: Kirk Ruths, MD   Subjective   Patient is seen at bedside.  He appears comfortable and in no acute respiratory distress.  Reports no change to his breathing.  He has had frequent loose BM with bowel prep today.  Inpatient Medications    Scheduled Meds:  atorvastatin  80 mg Oral Daily   [START ON 12/31/2021] dapagliflozin propanediol  10 mg Oral Daily   diclofenac Sodium  2 g Topical QID   ezetimibe  10 mg Oral Daily   furosemide  80 mg Oral Daily   guaiFENesin  600 mg Oral BID   insulin aspart  0-9 Units Subcutaneous TID WC   lactose free nutrition  237 mL Oral BID BM   metoprolol succinate  12.5 mg Oral Daily   multivitamin with minerals  1 tablet Oral Daily   pantoprazole  40 mg Oral Daily   sacubitril-valsartan  1 tablet Oral BID   sodium chloride flush  3 mL Intravenous Q12H   spironolactone  12.5 mg Oral Daily   Continuous Infusions:  PRN Meds: acetaminophen **OR** acetaminophen, ipratropium-albuterol   Vital Signs    Vitals:   12/30/21 0445 12/30/21 0726 12/30/21 0900 12/30/21 1103  BP: 116/62  (!) 108/52 111/66  Pulse: 82 87 84 85  Resp: 18 20 20    Temp: 97.7 F (36.5 C)  (!) 97.4 F (36.3 C)   TempSrc: Oral  Oral   SpO2: 90% 98% 93%   Weight: 71.9 kg     Height:        Intake/Output Summary (Last 24 hours) at 12/30/2021 1514 Last data filed at 12/30/2021 1300 Gross per 24 hour  Intake 240 ml  Output 850 ml  Net -610 ml   Filed Weights   12/28/21 0631 12/29/21 0424 12/30/21 0445  Weight: 73.5 kg 72.3 kg 71.9 kg    Telemetry    NSR - Personally Reviewed  ECG    2/9: A flutter. RBBB - Personally Reviewed  Physical Exam   GEN: No acute distress.   Cardiac: RRR, no murmurs, rubs, or gallops.  Respiratory: Mild crackles at bilateral lung bases. No wheezing  MS: No edema; No deformity. Neuro:  Nonfocal  Psych: Normal affect   Labs     Chemistry Recent Labs  Lab 12/25/21 0205 12/26/21 0117 12/28/21 0205 12/29/21 0147 12/30/21 0235  NA 142   < > 139 138 136  K 3.5   < > 3.9 3.5 3.6  CL 107   < > 107 106 104  CO2 22   < > 21* 19* 20*  GLUCOSE 135*   < > 118* 111* 126*  BUN 21   < > 21 22 23   CREATININE 1.47*   < > 1.53* 1.53* 1.52*  CALCIUM 8.3*   < > 8.5* 8.3* 8.5*  PROT 6.2*  --   --   --   --   ALBUMIN 2.6*  --   --   --  2.5*  AST 12*  --   --   --   --   ALT 9  --   --   --   --   ALKPHOS 62  --   --   --   --   BILITOT 0.6  --   --   --   --   GFRNONAA 51*   < > 49* 49* 49*  ANIONGAP 13   < >  11 13 12    < > = values in this interval not displayed.     Hematology Recent Labs  Lab 12/27/21 0620 12/29/21 0147 12/30/21 0235  WBC 11.5* 12.7* 12.9*  RBC 3.44* 3.48* 3.46*  HGB 9.8* 10.0* 9.5*  HCT 31.4* 31.1* 30.8*  MCV 91.3 89.4 89.0  MCH 28.5 28.7 27.5  MCHC 31.2 32.2 30.8  RDW 15.4 15.1 15.2  PLT 163 155 149*    Cardiac EnzymesNo results for input(s): TROPONINI in the last 168 hours. No results for input(s): TROPIPOC in the last 168 hours.   BNPNo results for input(s): BNP, PROBNP in the last 168 hours.   DDimer No results for input(s): DDIMER in the last 168 hours.   Radiology    No results found.  Cardiac Studies   Echo 12/24/21: 1. Left ventricular ejection fraction, by estimation, is 20 to 25%. The  left ventricle has severely decreased function. The left ventricle  demonstrates global hypokinesis. The left ventricular internal cavity size  was mildly dilated. Left ventricular  diastolic parameters are consistent with Grade II diastolic dysfunction  (pseudonormalization). Elevated left atrial pressure. Although there is  global hypokinesis, there is near akinesis and thinning of the myocardium  in the inferior wall, inferior  septum, anterior septum and apex, suggesting infarction in the right  coronary artery and LAD artery territories.   2. Right ventricular systolic  function is normal. The right ventricular  size is normal. There is moderately elevated pulmonary artery systolic  pressure.   3. Left atrial size was severely dilated.   4. Right atrial size was mildly dilated.   5. A small pericardial effusion is present. The pericardial effusion is  circumferential. There is no evidence of cardiac tamponade.   6. The mitral valve is normal in structure. Trivial mitral valve  regurgitation.   7. Tricuspid valve regurgitation is moderate.   8. The aortic valve is tricuspid. Aortic valve regurgitation is not  visualized. No aortic stenosis is present.   9. The inferior vena cava is dilated in size with >50% respiratory  variability, suggesting right atrial pressure of 8 mmHg.  Patient Profile     71 y.o. 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  New EF reduction (20-25%) down from 40% in 2020.  Unclear if he has an ischemic event with no prior EKG to compare.  Further work-up can be done outpatient. - Diuresis with Lasix p.o. 80 mg. Blood pressure on the lower side but stable. Put out 750 cc overnight. Wt stable. No supplemental oxygen. Kidney function at baseline.  - Continue GDMT with Toprol, Entresto, Aldactone and Farxiga - OK to proceed with endoscopy   Acute blood loss anemia per GI - restarted ASA Hb stable - EGD and colonoscopy tomorrow   CAD - Continue aspirin - For new reduction in EF, consider ischemic work-up outpatient   Possible atrial  flutter Seen on EKG 2/9. Could be exacerbated by his acute anemia. No history of atrial flutter in chart. NSR on tele. Regardless no anticoagulation in the setting of GI bleed.  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 No intervention at this time.  - follow up will be arranged with [facility] in 6 months  For questions or updates, please contact [place] Please consult [email or web address] for contact info under Cardiology/STEMI.  Signed, Gaylan Gerold, DO  12/30/2021, 3:14 PM     I have seen and examined the patient along with Gaylan Gerold, DO .  I have reviewed the chart, notes and new data.  I agree with PA/NP's note.  Key new complaints: prep is hard to drink, makes him nauseous, but no CV complaints Key examination changes: able to lie horizontally without trouble breathing Key new findings / data: stable renal function   PLAN: Continue same CV meds. Will follow up after endoscopy.  Sanda Klein, MD, [place] [phone] 12/30/2021, 4:55 PM

## 2021-12-30 NOTE — Assessment & Plan Note (Signed)
As evidenced by significant muscle mass and subcu fat loss and low BMI Nutrition Problem: Moderate Malnutrition Etiology: chronic illness (CHF, CAD) Signs/Symptoms: moderate fat depletion, moderate muscle depletion Interventions: Boost Plus, MVI

## 2021-12-30 NOTE — Progress Notes (Signed)
PROGRESS NOTE  Bob Johnson DXI:[id number] DOB: [birth year]   PCP: Fransisca Connors, FNP  Patient is from: Home. Followed with cardiology at [facility]: 12/22/2021 LOS: 6  Chief complaints:  Chief Complaint  Patient presents with   Abnormal Labs      Brief Narrative / Interim history: 71 year old M with PMH of CAD s/p BMS to LAD in 7673, ICM/systolic CHF, PAD, DM-2, HTN, HLD, penetrating atherosclerotic ulcer of aorta and former smoker presented to PCP on 2/8 with dyspnea and DOE for 2 weeks, decreased appetite and 19 pounds weight loss since 09/2021 and found to be anemic with Hgb of 7.2.  He was directed to ED, and admitted for symptomatic anemia.  Hemoccult positive.  CTA chest/abdomen/pelvis concerning for impending AAA rupture per radiology.  Patient was transferred from [facility] to [facility].  Vascular surgery reviewed the CTA and not concerned.   Patient developed acute respiratory distress due to decompensated CHF requiring IV Lasix and BiPAP.  Cardiology consulted, and continued on IV Lasix.  [facility] consulted for GI bleed.  Initially, patient and family wanted to wait until respiratory status improves.  Patient is now cleared for EGD/colonoscopy by cardiology, which are planned for 2/17.      Subjective: Seen and examined earlier this morning.  No major events overnight of this morning.  Complains some productive cough with whitish phlegm, orthopnea and right knee pain.  No shortness of breath at rest.  Denies chest pain, GI or UTI symptoms.  Attributing his right knee pain to arthritis.  Objective: Vitals:   12/30/21 0445 12/30/21 0726 12/30/21 0900 12/30/21 1103  BP: 116/62  (!) 108/52 111/66  Pulse: 82 87 84 85  Resp: 18 20 20    Temp: 97.7 F (36.5 C)  (!) 97.4 F (36.3 C)   TempSrc: Oral  Oral   SpO2: 90% 98% 93%   Weight: 71.9 kg     Height:        Examination:   GENERAL: Frail looking.  Nontoxic. HEENT: MMM.  Vision and hearing grossly intact.  NECK: Supple.  No  apparent JVD.  RESP: 93% on RA.  No IWOB.  Fair aeration bilaterally. CVS:  RRR. Heart sounds normal.  ABD/GI/GU: BS+. Abd soft, NTND.  MSK/EXT:  Moves extremities.  FROM in right knee.  No swelling or erythema. SKIN: no apparent skin lesion or wound NEURO: Awake and alert. Oriented appropriately.  No apparent focal neuro deficit. PSYCH: Calm. Normal affect.   Procedures:  None  Microbiology summarized: ALPFX-90 and influenza PCR nonreactive.  Assessment and Plan: * Acute on chronic systolic CHF (congestive heart failure) ([place])- (present on admission) TTE with LVEF of 20 to 25% (previously 40%), global hypokinesis, G2 DD.  Could be exacerbated by anemia and blood transfusion.  Started on IV Lasix with improvement in his respiratory symptoms and fluid status.  Intake and output incomplete but about 750 cc UOP/24 hours. -Cardiology managing-now on p.o. Lasix 80 mg daily -GDMT-Toprol-XL, Entresto, Farxiga, and Aldactone -Closely monitor intake and output and daily weight. -Monitor renal functions and electrolytes. -Sodium and fluid restriction  Acute respiratory failure with hypoxia (HCC) Likely due to the above.  Resolved.  On room air. -Manage CHF as above    Symptomatic iron deficiency anemia likely due to chronic GI bleed- (present on admission) Recent Labs    12/22/21 1941 12/23/21 0530 12/24/21 0645 12/25/21 0205 12/27/21 0620 12/29/21 0147 12/30/21 0235  HGB 7.1* 8.4* 9.9* 10.1* 9.8* 10.0* 9.5*  -Hgb 13.3 in  02/2021.  Hemoccult positive.  Anemia panel suggests iron deficiency. -Transfused 2 units -[facility] following-plan for EGD/[place] on 2/17 -Continue holding aspirin -Continue PPI  Chronic kidney disease, stage 3a ([place])- (present on admission) Recent Labs    12/22/21 1941 12/23/21 0530 12/24/21 0645 12/25/21 0205 12/26/21 0117 12/27/21 0620 12/28/21 0205 12/29/21 0147 12/30/21 0235  BUN 29* 26* 20 21 19 20 21 22 23   CREATININE 1.40* 1.24 1.44* 1.47* 1.54*  1.58* 1.53* 1.53* 1.52*  -Continue monitoring.  Aneurysm of infrarenal abdominal aorta- (present on admission) CT report was read as impending AAA rupture.  CT reviewed by vascular surgery.  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  He is not concerned about aortitis.  No surgical intervention indicated. -Outpatient follow-up in 6 months  Type 2 diabetes mellitus without complication, without long-term current use of insulin (HCC) A1c 5.6%. Recent Labs  Lab 12/29/21 1141 12/29/21 1638 12/29/21 2117 12/30/21 0636 12/30/21 1105  GLUCAP 119* 174* 116* 119* 144*  -Continue current insulin regimen -Continue Farxiga. -Continue holding home metformin  Right knee pain Likely osteoarthritis.  Exam reassuring. -Voltaren gel -Tylenol as needed  Malnutrition of moderate degree As evidenced by significant muscle mass and subcu fat loss and low BMI Nutrition Problem: Moderate Malnutrition Etiology: chronic illness (CHF, CAD) Signs/Symptoms: moderate fat depletion, moderate muscle depletion Interventions: Boost Plus, MVI     Bilateral pleural effusion- (present on admission) Likely due to CHF. -Diuretics as above.  Hypokalemia- (present on admission) K 3.6. -P.o. KCl 40x1  PAD (peripheral artery disease) ([place])- (present on admission) -Continue statins and Zetia. -Aspirin on hold due to FOBT + and anemia.  Mixed hyperlipidemia- (present on admission) -Continue with Lipitor and Zetia.  Essential hypertension- (present on admission) -Cardiac meds as above.  CAD (coronary artery disease)- (present on admission) Elevated troponin likely demand ischemia. -Manage CHF and anemia as above.         Body mass index is 21.44 kg/m. Nutrition Problem: Moderate Malnutrition Etiology: chronic illness (CHF, CAD) Signs/Symptoms: moderate fat depletion, moderate muscle depletion Interventions: Boost Plus, MVI Pressure Injury 12/23/21 Buttocks Stage 2 -   Partial thickness loss of dermis presenting as a shallow open injury with a red, pink wound bed without slough. areas of broken skin on inside of buttocks on right side. (Active)  12/23/21   Location: Buttocks  Location Orientation:   Staging: Stage 2 -  Partial thickness loss of dermis presenting as a shallow open injury with a red, pink wound bed without slough.  Wound Description (Comments): areas of broken skin on inside of buttocks on right side.  Present on Admission:    DVT prophylaxis:  SCDs Start: 12/22/21 2125  Code Status: Full code Family Communication: Updated patient's wife at bedside. Level of care: Progressive Status is: Inpatient Remains inpatient appropriate because: Due to acute on chronic combined CHF, symptomatic iron deficiency anemia     Final disposition: Likely home once medically stable and cleared.      Consultants:  Gastroenterology Cardiology   Sch Meds:  Scheduled Meds:  atorvastatin  80 mg Oral Daily   [START ON 12/31/2021] dapagliflozin propanediol  10 mg Oral Daily   diclofenac Sodium  2 g Topical QID   ezetimibe  10 mg Oral Daily   furosemide  80 mg Oral Daily   guaiFENesin  600 mg Oral BID   insulin aspart  0-9 Units Subcutaneous TID WC   lactose free nutrition  237 mL Oral BID BM  metoprolol succinate  12.5 mg Oral Daily   multivitamin with minerals  1 tablet Oral Daily   pantoprazole  40 mg Oral Daily   sacubitril-valsartan  1 tablet Oral BID   sodium chloride flush  3 mL Intravenous Q12H   spironolactone  12.5 mg Oral Daily   Continuous Infusions: PRN Meds:.acetaminophen **OR** acetaminophen, ipratropium-albuterol  Antimicrobials: Anti-infectives (From admission, onward)    None        I have personally reviewed the following labs and images: CBC: Recent Labs  Lab 12/24/21 0645 12/25/21 0205 12/27/21 0620 12/29/21 0147 12/30/21 0235  WBC 11.9* 13.1* 11.5* 12.7* 12.9*  HGB 9.9* 10.1* 9.8* 10.0* 9.5*  HCT 31.7*  32.3* 31.4* 31.1* 30.8*  MCV 91.4 90.7 91.3 89.4 89.0  PLT 204 190 163 155 149*   BMP &GFR Recent Labs  Lab 12/26/21 0117 12/27/21 0620 12/28/21 0205 12/29/21 0147 12/30/21 0235  NA 141 139 139 138 136  K 3.7 3.8 3.9 3.5 3.6  CL 107 104 107 106 104  CO2 22 23 21* 19* 20*  GLUCOSE 129* 116* 118* 111* 126*  BUN 19 20 21 22 23   CREATININE 1.54* 1.58* 1.53* 1.53* 1.52*  CALCIUM 8.4* 8.6* 8.5* 8.3* 8.5*  MG 1.9  --  2.5*  --  2.4  PHOS  --   --   --   --  3.2   Estimated Creatinine Clearance: 46 mL/min (A) (by C-G formula based on SCr of 1.52 mg/dL (H)). Liver & Pancreas: Recent Labs  Lab 12/25/21 0205 12/30/21 0235  AST 12*  --   ALT 9  --   ALKPHOS 62  --   BILITOT 0.6  --   PROT 6.2*  --   ALBUMIN 2.6* 2.5*   No results for input(s): LIPASE, AMYLASE in the last 168 hours. No results for input(s): AMMONIA in the last 168 hours. Diabetic: No results for input(s): HGBA1C in the last 72 hours. Recent Labs  Lab 12/29/21 1141 12/29/21 1638 12/29/21 2117 12/30/21 0636 12/30/21 1105  GLUCAP 119* 174* 116* 119* 144*   Cardiac Enzymes: No results for input(s): CKTOTAL, CKMB, CKMBINDEX, TROPONINI in the last 168 hours. No results for input(s): PROBNP in the last 8760 hours. Coagulation Profile: No results for input(s): INR, PROTIME in the last 168 hours. Thyroid Function Tests: No results for input(s): TSH, T4TOTAL, FREET4, T3FREE, THYROIDAB in the last 72 hours. Lipid Profile: No results for input(s): CHOL, HDL, LDLCALC, TRIG, CHOLHDL, LDLDIRECT in the last 72 hours. Anemia Panel: No results for input(s): VITAMINB12, FOLATE, FERRITIN, TIBC, IRON, RETICCTPCT in the last 72 hours. Urine analysis: No results found for: COLORURINE, APPEARANCEUR, LABSPEC, PHURINE, GLUCOSEU, HGBUR, BILIRUBINUR, KETONESUR, PROTEINUR, UROBILINOGEN, NITRITE, LEUKOCYTESUR Sepsis Labs: Invalid input(s): PROCALCITONIN, [place]  Microbiology: Recent Results (from the past 240 hour(s))   Resp Panel by RT-PCR (Flu A&B, Covid) Nasopharyngeal Swab     Status: None   Collection Time: 12/22/21  9:45 PM   Specimen: Nasopharyngeal Swab; Nasopharyngeal(NP) swabs in vial transport medium  Result Value Ref Range Status   SARS Coronavirus 2 by RT PCR NEGATIVE NEGATIVE Final    Comment: (NOTE) SARS-CoV-2 target nucleic acids are NOT DETECTED.  The SARS-CoV-2 RNA is generally detectable in upper respiratory specimens during the acute phase of infection. The lowest concentration of SARS-CoV-2 viral copies this assay can detect is 138 copies/mL. A negative result does not preclude SARS-Cov-2 infection and should not be used as the sole basis for treatment or other patient management decisions. A  negative result may occur with  improper specimen collection/handling, submission of specimen other than nasopharyngeal swab, presence of viral mutation(s) within the areas targeted by this assay, and inadequate number of viral copies(<138 copies/mL). A negative result must be combined with clinical observations, patient history, and epidemiological information. The expected result is Negative.  Fact Sheet for Patients:  EntrepreneurPulse.com.au  Fact Sheet for Healthcare Providers:  IncredibleEmployment.be  This test is no t yet approved or cleared by the [place] FDA and  has been authorized for detection and/or diagnosis of SARS-CoV-2 by FDA under an Emergency Use Authorization (EUA). This EUA will remain  in effect (meaning this test can be used) for the duration of the COVID-19 declaration under Section 564(b)(1) of the Act, 21 U.S.C.section 360bbb-3(b)(1), unless the authorization is terminated  or revoked sooner.       Influenza A by PCR NEGATIVE NEGATIVE Final   Influenza B by PCR NEGATIVE NEGATIVE Final    Comment: (NOTE) The Xpert Xpress SARS-CoV-2/FLU/RSV plus assay is intended as an aid in the diagnosis of influenza from  Nasopharyngeal swab specimens and should not be used as a sole basis for treatment. Nasal washings and aspirates are unacceptable for Xpert Xpress SARS-CoV-2/FLU/RSV testing.  Fact Sheet for Patients: EntrepreneurPulse.com.au  Fact Sheet for Healthcare Providers: IncredibleEmployment.be  This test is not yet approved or cleared by the [place] FDA and has been authorized for detection and/or diagnosis of SARS-CoV-2 by FDA under an Emergency Use Authorization (EUA). This EUA will remain in effect (meaning this test can be used) for the duration of the COVID-19 declaration under Section 564(b)(1) of the Act, 21 U.S.C. section 360bbb-3(b)(1), unless the authorization is terminated or revoked.  Performed at [facility], [place] [address] [place], [place] [address]     Radiology Studies: No results found.    Rhyker Silversmith T. [place]  If 7PM-7AM, please contact night-coverage [email or web address] 12/30/2021, 3:19 PM

## 2021-12-30 NOTE — Progress Notes (Signed)
Physical Therapy Treatment Patient Details Name: Bob Johnson MRN: [id number] DOB: [birth year] Today's Date: 12/30/2021   History of Present Illness Patient is a 71 y/o male who presents on 12/23/21 from PCP office due to abnormal labs. Found to have GI bleed, symptomatic anemia, SOB and acute on chronic heart failure. PMH includes CAD, DM, HTN, PVD, Ischemic cardiomyopathy, CHF    PT Comments    Pt reports onset of new rt knee pain which hindered mobility today. No obvious redness or edema noted at knee. Would expect mobility to improve if knee pain decreases.  Until then will maximize mobility.   Recommendations for follow up therapy are one component of a multi-disciplinary discharge planning process, led by the attending physician.  Recommendations may be updated based on patient status, additional functional criteria and insurance authorization.  Follow Up Recommendations  No PT follow up     Assistance Recommended at Discharge Intermittent Supervision/Assistance  Patient can return home with the following A little help with walking and/or transfers;Help with stairs or ramp for entrance   Equipment Recommendations  Rollator (4 wheels)    Recommendations for Other Services       Precautions / Restrictions Precautions Precautions: Fall;Other (comment) Precaution Comments: watch 02     Mobility  Bed Mobility Overal bed mobility: Modified Independent             General bed mobility comments: Pt using UE's to assist moving RLE due to knee pain    Transfers Overall transfer level: Needs assistance Equipment used: Rolling walker (2 wheels), Rollator (4 wheels) Transfers: Sit to/from Stand Sit to Stand: Min guard           General transfer comment: Assist for safety.    Ambulation/Gait Ambulation/Gait assistance: Min guard Gait Distance (Feet): 30 Feet Assistive device: Rolling walker (2 wheels) Gait Pattern/deviations: Step-through pattern, Decreased stance  time - right, Decreased step length - left, Antalgic Gait velocity: decreased Gait velocity interpretation: <1.31 ft/sec, indicative of household ambulator   General Gait Details: Pt limited by rt knee pain. Used rolling walker instead of rollator in order to [employer] rt knee to decr pain   Stairs             Wheelchair Mobility    Modified Rankin (Stroke Patients Only)       Balance Overall balance assessment: Needs assistance Sitting-balance support: Feet supported, No upper extremity supported Sitting balance-Leahy Scale: Good     Standing balance support: Bilateral upper extremity supported Standing balance-Leahy Scale: Fair Standing balance comment: using UE support due to pain not balance                            Cognition Arousal/Alertness: Awake/alert Behavior During Therapy: WFL for tasks assessed/performed Overall Cognitive Status: Within Functional Limits for tasks assessed                                          Exercises      General Comments General comments (skin integrity, edema, etc.): VSS on RA      Pertinent Vitals/Pain Pain Assessment Pain Assessment: Faces Faces Pain Scale: Hurts even more Pain Location: rt knee Pain Descriptors / Indicators: Aching Pain Intervention(s): Limited activity within patient's tolerance, Monitored during session, Repositioned    Home Living  Prior Function            PT Goals (current goals can now be found in the care plan section) Acute Rehab PT Goals Patient Stated Goal: to go home Progress towards PT goals: Not progressing toward goals - comment (new rt knee pain)    Frequency    Min 3X/week      PT Plan Current plan remains appropriate    Co-evaluation              AM-PAC PT "6 Clicks" Mobility   Outcome Measure  Help needed turning from your back to your side while in a flat bed without using bedrails?:  None Help needed moving from lying on your back to sitting on the side of a flat bed without using bedrails?: None Help needed moving to and from a bed to a chair (including a wheelchair)?: A Little Help needed standing up from a chair using your arms (e.g., wheelchair or bedside chair)?: A Little Help needed to walk in hospital room?: A Little Help needed climbing 3-5 steps with a railing? : A Little 6 Click Score: 20    End of Session   Activity Tolerance: Patient limited by pain Patient left: in bed;with call bell/phone within reach;with family/visitor present Nurse Communication: Mobility status PT Visit Diagnosis: Unsteadiness on feet (R26.81);Other abnormalities of gait and mobility (R26.89);Pain Pain - Right/Left: Right Pain - part of body: Knee     Time: 5329-9242 PT Time Calculation (min) (ACUTE ONLY): 12 min  Charges:  $Gait Training: 8-22 mins                     [place]irby Pager [phone] Office [place] 12/30/2021, 1:51 PM

## 2021-12-30 NOTE — H&P (View-Only) (Signed)
[facility] Gastroenterology Progress Note  [facility] 71 y.o. [birth year]  CC: Anemia, heme positive stool.   Subjective: Patient seen and examined at bedside.  He is feeling better .  Not on oxygen supplements at this time.  Denies any GI symptoms  ROS :   Negative for abdominal pain and fever   Objective: Vital signs in last 24 hours: Vitals:   12/30/21 0726 12/30/21 0900  BP:  (!) 108/52  Pulse: 87 84  Resp: 20 20  Temp:  (!) 97.4 F (36.3 C)  SpO2: 98% 93%    Physical Exam:  General:  Alert, cooperative, no distress, appears stated age.  Oxygen by nasal cannula  Head:  Normocephalic, without obvious abnormality, atraumatic  Eyes:  , EOM's intact,   Lungs:   Basilar crackles noted  Heart:  Regular rate and rhythm, S1, S2 normal  Abdomen:   Soft, non-tender, nondistended, bowel sounds present.  No peritoneal signs  Extremities: Extremities normal, atraumatic, no  edema  Psych Mood and affect normal     Lab Results: Recent Labs    12/28/21 0205 12/29/21 0147 12/30/21 0235  NA 139 138 136  K 3.9 3.5 3.6  CL 107 106 104  CO2 21* 19* 20*  GLUCOSE 118* 111* 126*  BUN 21 22 23   CREATININE 1.53* 1.53* 1.52*  CALCIUM 8.5* 8.3* 8.5*  MG 2.5*  --  2.4  PHOS  --   --  3.2   Recent Labs    12/30/21 0235  ALBUMIN 2.5*    Recent Labs    12/29/21 0147 12/30/21 0235  WBC 12.7* 12.9*  HGB 10.0* 9.5*  HCT 31.1* 30.8*  MCV 89.4 89.0  PLT 155 149*   No results for input(s): LABPROT, INR in the last 72 hours.    Assessment/Plan: -Symptomatic anemia with heme positive stool.  No overt bleeding -Ischemic cardiomyopathy.  EF has dropped to 20 to 25%.  Feeling better now.  Not on oxygen supplement at this time  Recommendations ------------------------- -Plan for EGD and colonoscopy tomorrow. -Clear liquid diet today.  Keep n.p.o. past midnight.  Start colonoscopy prep this afternoon.  Risks (bleeding, infection, bowel perforation that could require surgery,  sedation-related changes in cardiopulmonary systems), benefits (identification and possible treatment of source of symptoms, exclusion of certain causes of symptoms), and alternatives (watchful waiting, radiographic imaging studies, empiric medical treatment)  were explained to patient/family in detail and patient wishes to proceed.    [name] MD, Start 12/30/2021, 9:28 AM  Contact #  [phone]

## 2021-12-31 ENCOUNTER — Encounter (HOSPITAL_COMMUNITY): Payer: Self-pay | Admitting: Internal Medicine

## 2021-12-31 ENCOUNTER — Encounter (HOSPITAL_COMMUNITY)
Admission: EM | Disposition: A | Discharge status: Home Health | Payer: Self-pay | Source: Ambulatory Visit | Attending: Internal Medicine

## 2021-12-31 ENCOUNTER — Inpatient Hospital Stay (HOSPITAL_COMMUNITY): Payer: Medicare Other | Admitting: Anesthesiology

## 2021-12-31 DIAGNOSIS — K2970 Gastritis, unspecified, without bleeding: Secondary | ICD-10-CM

## 2021-12-31 DIAGNOSIS — D490 Neoplasm of unspecified behavior of digestive system: Secondary | ICD-10-CM

## 2021-12-31 DIAGNOSIS — C159 Malignant neoplasm of esophagus, unspecified: Secondary | ICD-10-CM

## 2021-12-31 DIAGNOSIS — C155 Malignant neoplasm of lower third of esophagus: Secondary | ICD-10-CM

## 2021-12-31 DIAGNOSIS — K2950 Unspecified chronic gastritis without bleeding: Secondary | ICD-10-CM

## 2021-12-31 DIAGNOSIS — K621 Rectal polyp: Secondary | ICD-10-CM

## 2021-12-31 DIAGNOSIS — K5520 Angiodysplasia of colon without hemorrhage: Secondary | ICD-10-CM

## 2021-12-31 HISTORY — PX: BIOPSY: SHX5522

## 2021-12-31 HISTORY — PX: COLONOSCOPY: SHX5424

## 2021-12-31 HISTORY — PX: ESOPHAGOGASTRODUODENOSCOPY: SHX5428

## 2021-12-31 HISTORY — PX: POLYPECTOMY: SHX5525

## 2021-12-31 LAB — CBC
HCT: 30 % — ABNORMAL LOW (ref 39.0–52.0)
Hemoglobin: 9.3 g/dL — ABNORMAL LOW (ref 13.0–17.0)
MCH: 27.7 pg (ref 26.0–34.0)
MCHC: 31 g/dL (ref 30.0–36.0)
MCV: 89.3 fL (ref 80.0–100.0)
Platelets: 149 10*3/uL — ABNORMAL LOW (ref 150–400)
RBC: 3.36 MIL/uL — ABNORMAL LOW (ref 4.22–5.81)
RDW: 15.4 % (ref 11.5–15.5)
WBC: 12.6 10*3/uL — ABNORMAL HIGH (ref 4.0–10.5)
nRBC: 0 % (ref 0.0–0.2)

## 2021-12-31 LAB — GLUCOSE, CAPILLARY
Glucose-Capillary: 131 mg/dL — ABNORMAL HIGH (ref 70–99)
Glucose-Capillary: 114 mg/dL — ABNORMAL HIGH (ref 70–99)
Glucose-Capillary: 132 mg/dL — ABNORMAL HIGH (ref 70–99)
Glucose-Capillary: 160 mg/dL — ABNORMAL HIGH (ref 70–99)
Glucose-Capillary: 129 mg/dL — ABNORMAL HIGH (ref 70–99)

## 2021-12-31 LAB — RENAL FUNCTION PANEL
Albumin: 2.4 g/dL — ABNORMAL LOW (ref 3.5–5.0)
Anion gap: 11 (ref 5–15)
BUN: 22 mg/dL (ref 8–23)
CO2: 20 mmol/L — ABNORMAL LOW (ref 22–32)
Calcium: 8.5 mg/dL — ABNORMAL LOW (ref 8.9–10.3)
Chloride: 105 mmol/L (ref 98–111)
Creatinine, Ser: 1.46 mg/dL — ABNORMAL HIGH (ref 0.61–1.24)
GFR, Estimated: 51 mL/min — ABNORMAL LOW (ref >60)
Glucose, Bld: 127 mg/dL — ABNORMAL HIGH (ref 70–99)
Phosphorus: 3.5 mg/dL (ref 2.5–4.6)
Potassium: 3.7 mmol/L (ref 3.5–5.1)
Sodium: 136 mmol/L (ref 135–145)

## 2021-12-31 LAB — MAGNESIUM: Magnesium: 2.3 mg/dL (ref 1.7–2.4)

## 2021-12-31 SURGERY — COLONOSCOPY
Anesthesia: Monitor Anesthesia Care

## 2021-12-31 MED ORDER — PHENYLEPHRINE HCL (PRESSORS) 10 MG/ML IV SOLN
INTRAVENOUS | Status: DC | PRN
  Administered 2021-12-31: 80 ug via INTRAVENOUS

## 2021-12-31 MED ORDER — METOPROLOL SUCCINATE ER 25 MG PO TB24
25.0000 mg | ORAL_TABLET | Freq: Every day | ORAL | Status: DC
Start: 2022-01-01 — End: 2022-01-01
  Administered 2022-01-01: 25 mg via ORAL
  Filled 2021-12-31: qty 1

## 2021-12-31 MED ORDER — PHENYLEPHRINE HCL-NACL 20-0.9 MG/250ML-% IV SOLN
INTRAVENOUS | Status: DC | PRN
  Administered 2021-12-31: 50 ug/min via INTRAVENOUS

## 2021-12-31 MED ORDER — PROPOFOL 500 MG/50ML IV EMUL
INTRAVENOUS | Status: DC | PRN
  Administered 2021-12-31: 100 ug/kg/min via INTRAVENOUS

## 2021-12-31 MED ORDER — LACTATED RINGERS IV SOLN
INTRAVENOUS | Status: DC
Start: 2021-12-31 — End: 2021-12-31
  Administered 2021-12-31: 1000 mL via INTRAVENOUS

## 2021-12-31 NOTE — Op Note (Signed)
[facility] Patient Name: Bob Johnson Procedure Date : 12/31/2021 MRN: [id number] Attending MD: [name] , MD Date of Birth: Apr 25, 1951 CSN: [id number] Age: 71 Admit Type: Inpatient Procedure:                Colonoscopy Indications:              Iron deficiency anemia Providers:                [name], MD, Jeanella Cara, RN,                            Luan Moore, Technician, York Pellant, CRNA Referring MD:              Medicines:                Sedation Administered by an Anesthesia Professional Complications:            No immediate complications. Estimated Blood Loss:     Estimated blood loss was minimal. Procedure:                Pre-Anesthesia Assessment:                           - Prior to the procedure, a History and Physical                            was performed, and patient medications and                            allergies were reviewed. The patient's tolerance of                            previous anesthesia was also reviewed. The risks                            and benefits of the procedure and the sedation                            options and risks were discussed with the patient.                            All questions were answered, and informed consent                            was obtained. Prior Anticoagulants: The patient has                            taken no previous anticoagulant or antiplatelet                            agents. ASA Grade Assessment: IV - A patient with                            severe systemic disease that is a constant threat  to life. After reviewing the risks and benefits,                            the patient was deemed in satisfactory condition to                            undergo the procedure.                           After obtaining informed consent, the colonoscope                            was passed under direct vision. Throughout the                             procedure, the patient's blood pressure, pulse, and                            oxygen saturations were monitored continuously. The                            [id number]-HQ190L ([id number] Olympus colonoscope was                            introduced through the anus and advanced to the the                            cecum, identified by appendiceal orifice and                            ileocecal valve. The colonoscopy was performed                            without difficulty. The patient tolerated the                            procedure well. The quality of the bowel                            preparation was fair. Scope In: 11:13:50 AM Scope Out: 11:33:59 AM Scope Withdrawal Time: 0 hours 14 minutes 57 seconds  Total Procedure Duration: 0 hours 20 minutes 9 seconds  Findings:      Skin tags were found on perianal exam.      A single small localized angioectasia without bleeding was found in the       cecum.      A 7 mm polyp was found in the cecum. The polyp was sessile. The polyp       was removed with a cold snare. Resection and retrieval were complete.      A 6 mm polyp was found in the hepatic flexure. The polyp was sessile.       The polyp was removed with a cold snare. Resection and retrieval were       complete.      Two sessile polyps were found in the sigmoid colon. The polyps were  3 to       7 mm in size. These polyps were removed with a cold snare. Resection and       retrieval were complete.      Three sessile polyps were found in the rectum. The polyps were 6 to 8 mm       in size. These polyps were removed with a cold snare. Resection and       retrieval were complete.      Many small and large-mouthed diverticula were found in the sigmoid colon.      Internal hemorrhoids were found during retroflexion. The hemorrhoids       were medium-sized. Impression:               - Preparation of the colon was fair.                           - Perianal skin tags found  on perianal exam.                           - A single non-bleeding colonic angioectasia.                           - One 7 mm polyp in the cecum, removed with a cold                            snare. Resected and retrieved.                           - One 6 mm polyp at the hepatic flexure, removed                            with a cold snare. Resected and retrieved.                           - Two 3 to 7 mm polyps in the sigmoid colon,                            removed with a cold snare. Resected and retrieved.                           - Three 6 to 8 mm polyps in the rectum, removed                            with a cold snare. Resected and retrieved.                           - Diverticulosis in the sigmoid colon.                           - Internal hemorrhoids. Recommendation:           - Patient has a contact number available for                            emergencies. The signs and symptoms of potential  delayed complications were discussed with the                            patient. Return to normal activities tomorrow.                            Written discharge instructions were provided to the                            patient.                           - Resume previous diet.                           - Continue present medications.                           - Await pathology results.                           - Repeat colonoscopy date to be determined after                            pending pathology results are reviewed for                            surveillance of multiple polyps.                           - Return to GI office in 6 months. Procedure Code(s):        --- Professional ---                           [phone], Colonoscopy, flexible; with removal of                            tumor(s), polyp(s), or other lesion(s) by snare                            technique Diagnosis Code(s):        --- Professional ---                            K64.8, Other hemorrhoids                           K55.20, Angiodysplasia of colon without hemorrhage                           K63.5, Polyp of colon                           K62.1, Rectal polyp                           K64.4, Residual hemorrhoidal skin tags  D50.9, Iron deficiency anemia, unspecified                           K57.30, Diverticulosis of large intestine without                            perforation or abscess without bleeding CPT copyright 2019 American Medical Association. All rights reserved. The codes documented in this report are preliminary and upon coder review may  be revised to meet current compliance requirements. [name], MD [name], MD 12/31/2021 11:55:31 AM Number of Addenda: 0

## 2021-12-31 NOTE — Assessment & Plan Note (Addendum)
Nonbleeding at the time of EGD. -Continue p.o. Protonix 40 mg daily

## 2021-12-31 NOTE — Interval H&P Note (Signed)
History and Physical Interval Note:  12/31/2021 10:34 AM  [facility]  has presented today for surgery, with the diagnosis of anemia.  The various methods of treatment have been discussed with the patient and family. After consideration of risks, benefits and other options for treatment, the patient has consented to  Procedure(s): COLONOSCOPY (N/A) ESOPHAGOGASTRODUODENOSCOPY (EGD) (N/A) as a surgical intervention.  The patient's history has been reviewed, patient examined, no change in status, stable for surgery.  I have reviewed the patient's chart and labs.  Questions were answered to the patient's satisfaction.     Bob Johnson

## 2021-12-31 NOTE — Brief Op Note (Signed)
12/22/2021 - 12/31/2021  11:55 AM  PATIENT:  Bob Johnson  71 y.o. male  PRE-OPERATIVE DIAGNOSIS:  anemia  POST-OPERATIVE DIAGNOSIS:  gastric/distal esophageal lesion- biopsied.  1-cecal, 1-hepatic flexure, 2-sigmoid and 3-rectal polypectomy   PROCEDURE:  Procedure(s): COLONOSCOPY (N/A) ESOPHAGOGASTRODUODENOSCOPY (EGD) (N/A) BIOPSY POLYPECTOMY  SURGEON:  Surgeon(s) and Role:    * Jantzen Pilger, MD - Primary  Findings ------------ -EGD showed polypoid lesion in the distal esophagus as well as masslike lesion in the gastric cardia.  Concerning for malignancy.  Biopsies taken. -Colonoscopy showed multiple polyps, diverticulosis and hemorrhoids.  Recommendations ------------------------- -Soft diet -Recommend twice daily PPI until work-up for esophageal/gastric cardia lesion is completed -Patient had a CT chest abdomen pelvis which did showed multiple enlarged mediastinal and hilar lymph nodes but otherwise no evidence of malignancy seen -Recommend outpatient oncology consult for further evaluation.  May need outpatient PET scan depending on biopsy findings. -No further inpatient GI work-up planned.  GI will sign off.  Call us back if needed  [name] MD, [place] 12/31/2021, 11:58 AM  Contact #  [phone]

## 2021-12-31 NOTE — Progress Notes (Signed)
Pt returned from endoscopy.  Pt is A&O X4 and neuro intact.  Vitals taken and within normal range.  Pt is currently comfortable and not in pain.  Call light within reach.

## 2021-12-31 NOTE — Assessment & Plan Note (Addendum)
Noted on EGD on 12/31/2021.  Pathology pending. -GI to follow-up on pathology.

## 2021-12-31 NOTE — Progress Notes (Signed)
HF remains well compensated, lies fully flat in bed. EGD concerning for neoplasm of the lower esophagus/cardia. BP better, increased metoprolol succinate to 25 mg daily By time of DC can probably increase back to his home dose of 50 mg daily.   [facility] will sign off.   Medication Recommendations:   Atorvastatin 80 mg daily Farxiga 10 mg daily Zetia 10 mg daily Entresto 24-26 mg twice daily Spironolactone 12.5 mg daily Metoprolol 25 mg daily (increase to 509 mg daily at DC if BP allows) Other recommendations (labs, testing, etc):  daily weight monitoring, call if wt increases by 3 lb/day or 5 lb/week Follow up as an outpatient:  will schedule an appt in 3-4 weeks

## 2021-12-31 NOTE — Assessment & Plan Note (Signed)
POA Pressure Injury 12/23/21 Buttocks Stage 2 -  Partial thickness loss of dermis presenting as a shallow open injury with a red, pink wound bed without slough. areas of broken skin on inside of buttocks on right side. (Active)  12/23/21   Location: Buttocks  Location Orientation:   Staging: Stage 2 -  Partial thickness loss of dermis presenting as a shallow open injury with a red, pink wound bed without slough.  Wound Description (Comments): areas of broken skin on inside of buttocks on right side.  Present on Admission:

## 2021-12-31 NOTE — Progress Notes (Addendum)
PROGRESS NOTE  Bob Johnson VOH:[id number] DOB: [birth year]   PCP: Fransisca Connors, FNP  Patient is from: Home. Followed with cardiology at [place]: 12/22/2021 LOS: 7  Chief complaints:  Chief Complaint  Patient presents with   Abnormal Labs      Brief Narrative / Interim history: 71 year old M with PMH of CAD s/p BMS to LAD in 6948, ICM/systolic CHF, PAD, DM-2, HTN, HLD, penetrating atherosclerotic ulcer of aorta and former smoker presented to PCP on 2/8 with dyspnea and DOE for 2 weeks, decreased appetite and 19 pounds weight loss since 09/2021 and found to be anemic with Hgb of 7.2.  He was directed to ED, and admitted for symptomatic anemia.  Hemoccult positive.  CTA chest/abdomen/pelvis concerning for impending AAA rupture per radiology.  Patient was transferred from [facility] to [facility].  Vascular surgery reviewed the CTA and not concerned.   Patient developed acute respiratory distress due to decompensated CHF requiring IV Lasix and BiPAP.  Cardiology consulted.  Now on p.o. Lasix.  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   Colonoscopy on 2/17 with polyps (resected and retrieved) diverticulosis and internal hemorrhoids.      Subjective: Seen and examined earlier this morning before he went down for his EGD and colonoscopy.  No major events overnight of this morning.  Still with some dry cough.  He denies shortness of breath.   Objective: Vitals:   12/31/21 1145 12/31/21 1200 12/31/21 1232 12/31/21 1600  BP: (!) 96/58 (!) 116/57 117/61 100/70  Pulse:   83 96  Resp: (!) 26 (!) 22 20 20   Temp:  97.8 F (36.6 C) (!) 97.4 F (36.3 C) (!) 97.4 F (36.3 C)  TempSrc:   Oral Oral  SpO2: 97% 97% 93% 91%  Weight:      Height:        Examination: GENERAL: Frail looking.  Nontoxic. HEENT: MMM.  Vision and  hearing grossly intact.  NECK: Supple.  No apparent JVD.  RESP: 91% on RA.  No IWOB.  Fair aeration bilaterally. CVS:  RRR. Heart sounds normal.  ABD/GI/GU: BS+. Abd soft, NTND.  MSK/EXT:  Moves extremities.  Significant muscle mass and subcu fat loss. SKIN: no apparent skin lesion or wound NEURO: Awake and alert. Oriented appropriately.  No apparent focal neuro deficit. PSYCH: Calm. Normal affect.   Procedures:  None  Microbiology summarized: NIOEV-03 and influenza PCR nonreactive.  Assessment and Plan: * Acute on chronic systolic CHF (congestive heart failure) ([place])- (present on admission) TTE with LVEF of 20 to 25% (previously 40%), global hypokinesis, G2 DD.  Could be exacerbated by anemia and blood transfusion.  Started on IV Lasix with improvement in his respiratory symptoms and fluid status.  Intake and output incomplete, only 350 cc urine output charted. -Cardiology managing-now on p.o. Lasix 80 mg daily -GDMT-Toprol-XL, Entresto, Farxiga, and Aldactone -Closely monitor intake and output and daily weight. -Monitor renal functions and electrolytes. -Sodium and fluid restriction  Acute respiratory failure with hypoxia (HCC) Likely due to the above.  Resolved.  On room air. -Manage CHF as above    Esophageal and gastric tumor Noted on EGD on 12/31/2021.  Pathology pending. -Follow-up pathology  Symptomatic iron deficiency anemia likely due to chronic GI bleed- (present on admission) Recent Labs    12/22/21 1941 12/23/21 0530 12/24/21 0645 12/25/21 0205 12/27/21 5009  12/29/21 0147 12/30/21 0235 12/31/21 0132  HGB 7.1* 8.4* 9.9* 10.1* 9.8* 10.0* 9.5* 9.3*  -Hgb 13.3 in 02/2021.  Hemoccult positive.  Anemia panel suggests iron deficiency. -EGD and colonoscopy as above. -H&H stable after 2 units.  -Continue holding aspirin -Continue PPI  Gastritis and esophagitis Nonbleeding at the time of EGD. -Continue PPI  Chronic kidney disease, stage 3a ([place])- (present on  admission) Recent Labs    12/22/21 1941 12/23/21 0530 12/24/21 0645 12/25/21 0205 12/26/21 0117 12/27/21 0620 12/28/21 0205 12/29/21 0147 12/30/21 0235  BUN 29* 26* 20 21 19 20 21 22 23   CREATININE 1.40* 1.24 1.44* 1.47* 1.54* 1.58* 1.53* 1.53* 1.52*  -Continue monitoring.  Aneurysm of infrarenal abdominal aorta- (present on admission) CT report was read as impending AAA rupture.  CT reviewed by vascular surgery.  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  He is not concerned about aortitis.  No surgical intervention indicated. -Outpatient follow-up in 6 months  Type 2 diabetes mellitus without complication, without long-term current use of insulin (HCC) A1c 5.6%. Recent Labs  Lab 12/29/21 1141 12/29/21 1638 12/29/21 2117 12/30/21 0636 12/30/21 1105  GLUCAP 119* 174* 116* 119* 144*  -Continue current insulin regimen -Continue Farxiga. -Continue holding home metformin  Right knee pain Likely osteoarthritis.  Exam reassuring. -Voltaren gel -Tylenol as needed  Pressure injury of skin POA Pressure Injury 12/23/21 Buttocks Stage 2 -  Partial thickness loss of dermis presenting as a shallow open injury with a red, pink wound bed without slough. areas of broken skin on inside of buttocks on right side. (Active)  12/23/21   Location: Buttocks  Location Orientation:   Staging: Stage 2 -  Partial thickness loss of dermis presenting as a shallow open injury with a red, pink wound bed without slough.  Wound Description (Comments): areas of broken skin on inside of buttocks on right side.  Present on Admission:         Malnutrition of moderate degree As evidenced by significant muscle mass and subcu fat loss and low BMI Nutrition Problem: Moderate Malnutrition Etiology: chronic illness (CHF, CAD) Signs/Symptoms: moderate fat depletion, moderate muscle depletion Interventions: Boost Plus, MVI     Bilateral pleural effusion- (present on  admission) Likely due to CHF. -Diuretics as above.  Hypokalemia- (present on admission) K 3.6. -P.o. KCl 40x1  PAD (peripheral artery disease) ([place])- (present on admission) -Continue statins and Zetia. -Aspirin on hold due to FOBT + and anemia.  Mixed hyperlipidemia- (present on admission) -Continue with Lipitor and Zetia.  Essential hypertension- (present on admission) -Cardiac meds as above.  CAD (coronary artery disease)- (present on admission) Elevated troponin likely demand ischemia. -Manage CHF and anemia as above.    DVT prophylaxis:  SCDs Start: 12/22/21 2125  Code Status: Full code Family Communication: Updated patient's wife at bedside. Level of care: Progressive Status is: Inpatient Remains inpatient appropriate because:  acute on chronic combined CHF, symptomatic iron deficiency anemia     Final disposition: Likely home on 2/18.      Consultants:  Gastroenterology Cardiology   Sch Meds:  Scheduled Meds:  atorvastatin  80 mg Oral Daily   dapagliflozin propanediol  10 mg Oral Daily   diclofenac Sodium  2 g Topical QID   ezetimibe  10 mg Oral Daily   furosemide  80 mg Oral Daily   guaiFENesin  600 mg Oral BID   insulin aspart  0-9 Units Subcutaneous TID WC   lactose free nutrition  237  mL Oral BID BM   [START ON 01/01/2022] metoprolol succinate  25 mg Oral Daily   multivitamin with minerals  1 tablet Oral Daily   pantoprazole  40 mg Oral Daily   sacubitril-valsartan  1 tablet Oral BID   sodium chloride flush  3 mL Intravenous Q12H   spironolactone  12.5 mg Oral Daily   Continuous Infusions: PRN Meds:.acetaminophen **OR** acetaminophen, ipratropium-albuterol  Antimicrobials: Anti-infectives (From admission, onward)    None        I have personally reviewed the following labs and images: CBC: Recent Labs  Lab 12/25/21 0205 12/27/21 0620 12/29/21 0147 12/30/21 0235 12/31/21 0132  WBC 13.1* 11.5* 12.7* 12.9* 12.6*  HGB 10.1*  9.8* 10.0* 9.5* 9.3*  HCT 32.3* 31.4* 31.1* 30.8* 30.0*  MCV 90.7 91.3 89.4 89.0 89.3  PLT 190 163 155 149* 149*   BMP &GFR Recent Labs  Lab 12/26/21 0117 12/27/21 0620 12/28/21 0205 12/29/21 0147 12/30/21 0235 12/31/21 0132  NA 141 139 139 138 136 136  K 3.7 3.8 3.9 3.5 3.6 3.7  CL 107 104 107 106 104 105  CO2 22 23 21* 19* 20* 20*  GLUCOSE 129* 116* 118* 111* 126* 127*  BUN 19 20 21 22 23 22   CREATININE 1.54* 1.58* 1.53* 1.53* 1.52* 1.46*  CALCIUM 8.4* 8.6* 8.5* 8.3* 8.5* 8.5*  MG 1.9  --  2.5*  --  2.4 2.3  PHOS  --   --   --   --  3.2 3.5   Estimated Creatinine Clearance: 47.9 mL/min (A) (by C-G formula based on SCr of 1.46 mg/dL (H)). Liver & Pancreas: Recent Labs  Lab 12/25/21 0205 12/30/21 0235 12/31/21 0132  AST 12*  --   --   ALT 9  --   --   ALKPHOS 62  --   --   BILITOT 0.6  --   --   PROT 6.2*  --   --   ALBUMIN 2.6* 2.5* 2.4*   No results for input(s): LIPASE, AMYLASE in the last 168 hours. No results for input(s): AMMONIA in the last 168 hours. Diabetic: No results for input(s): HGBA1C in the last 72 hours. Recent Labs  Lab 12/30/21 2129 12/31/21 0612 12/31/21 1156 12/31/21 1233 12/31/21 1557  GLUCAP 134* 131* 114* 132* 160*   Cardiac Enzymes: No results for input(s): CKTOTAL, CKMB, CKMBINDEX, TROPONINI in the last 168 hours. No results for input(s): PROBNP in the last 8760 hours. Coagulation Profile: No results for input(s): INR, PROTIME in the last 168 hours. Thyroid Function Tests: No results for input(s): TSH, T4TOTAL, FREET4, T3FREE, THYROIDAB in the last 72 hours. Lipid Profile: No results for input(s): CHOL, HDL, LDLCALC, TRIG, CHOLHDL, LDLDIRECT in the last 72 hours. Anemia Panel: No results for input(s): VITAMINB12, FOLATE, FERRITIN, TIBC, IRON, RETICCTPCT in the last 72 hours. Urine analysis: No results found for: COLORURINE, APPEARANCEUR, LABSPEC, PHURINE, GLUCOSEU, HGBUR, BILIRUBINUR, KETONESUR, PROTEINUR, UROBILINOGEN,  NITRITE, LEUKOCYTESUR Sepsis Labs: Invalid input(s): PROCALCITONIN, [place]  Microbiology: Recent Results (from the past 240 hour(s))  Resp Panel by RT-PCR (Flu A&B, Covid) Nasopharyngeal Swab     Status: None   Collection Time: 12/22/21  9:45 PM   Specimen: Nasopharyngeal Swab; Nasopharyngeal(NP) swabs in vial transport medium  Result Value Ref Range Status   SARS Coronavirus 2 by RT PCR NEGATIVE NEGATIVE Final    Comment: (NOTE) SARS-CoV-2 target nucleic acids are NOT DETECTED.  The SARS-CoV-2 RNA is generally detectable in upper respiratory specimens during the acute phase of infection.  The lowest concentration of SARS-CoV-2 viral copies this assay can detect is 138 copies/mL. A negative result does not preclude SARS-Cov-2 infection and should not be used as the sole basis for treatment or other patient management decisions. A negative result may occur with  improper specimen collection/handling, submission of specimen other than nasopharyngeal swab, presence of viral mutation(s) within the areas targeted by this assay, and inadequate number of viral copies(<138 copies/mL). A negative result must be combined with clinical observations, patient history, and epidemiological information. The expected result is Negative.  Fact Sheet for Patients:  EntrepreneurPulse.com.au  Fact Sheet for Healthcare Providers:  IncredibleEmployment.be  This test is no t yet approved or cleared by the [place] FDA and  has been authorized for detection and/or diagnosis of SARS-CoV-2 by FDA under an Emergency Use Authorization (EUA). This EUA will remain  in effect (meaning this test can be used) for the duration of the COVID-19 declaration under Section 564(b)(1) of the Act, 21 U.S.C.section 360bbb-3(b)(1), unless the authorization is terminated  or revoked sooner.       Influenza A by PCR NEGATIVE NEGATIVE Final   Influenza B by PCR NEGATIVE  NEGATIVE Final    Comment: (NOTE) The Xpert Xpress SARS-CoV-2/FLU/RSV plus assay is intended as an aid in the diagnosis of influenza from Nasopharyngeal swab specimens and should not be used as a sole basis for treatment. Nasal washings and aspirates are unacceptable for Xpert Xpress SARS-CoV-2/FLU/RSV testing.  Fact Sheet for Patients: EntrepreneurPulse.com.au  Fact Sheet for Healthcare Providers: IncredibleEmployment.be  This test is not yet approved or cleared by the [place] FDA and has been authorized for detection and/or diagnosis of SARS-CoV-2 by FDA under an Emergency Use Authorization (EUA). This EUA will remain in effect (meaning this test can be used) for the duration of the COVID-19 declaration under Section 564(b)(1) of the Act, 21 U.S.C. section 360bbb-3(b)(1), unless the authorization is terminated or revoked.  Performed at [facility], [place] [address] [place], [place] [address]     Radiology Studies: No results found.    Theone Bowell T. Randall  If 7PM-7AM, please contact night-coverage [email or web address] 12/31/2021, 5:39 PM

## 2021-12-31 NOTE — Care Management Important Message (Signed)
Important Message  Patient Details  Name: Bob Johnson MRN: [id number] Date of Birth: [birth year]   Medicare Important Message Given:  Yes     [name] 12/31/2021, 9:42 AM

## 2021-12-31 NOTE — Transfer of Care (Signed)
Immediate Anesthesia Transfer of Care Note  Patient: [facility]  Procedure(s) Performed: COLONOSCOPY ESOPHAGOGASTRODUODENOSCOPY (EGD) BIOPSY POLYPECTOMY  Patient Location: PACU  Anesthesia Type:MAC  Level of Consciousness: drowsy and patient cooperative  Airway & Oxygen Therapy: Patient Spontanous Breathing and Patient connected to nasal cannula oxygen  Post-op Assessment: Report given to RN and Post -op Vital signs reviewed and stable  Post vital signs: Reviewed and stable  Last Vitals:  Vitals Value Taken Time  BP 117/61 12/31/21 1232  Temp 36.3 C 12/31/21 1232  Pulse 83 12/31/21 1232  Resp 24 12/31/21 1445  SpO2 97 % 12/31/21 1445  Vitals shown include unvalidated device data.  Last Pain:  Vitals:   12/31/21 1232  TempSrc: Oral  PainSc: 0-No pain         Complications: No notable events documented.

## 2021-12-31 NOTE — Op Note (Signed)
[facility] Patient Name: Bob Johnson Procedure Date : 12/31/2021 MRN: [id number] Attending MD: [name] , MD Date of Birth: 01-06-1951 CSN: [id number] Age: 71 Admit Type: Inpatient Procedure:                Upper GI endoscopy Indications:              Iron deficiency anemia Providers:                [name], MD, Jeanella Cara, RN,                            Luan Moore, Technician, York Pellant, CRNA Referring MD:              Medicines:                Sedation Administered by an Anesthesia Professional Complications:            No immediate complications. Estimated Blood Loss:     Estimated blood loss was minimal. Procedure:                Pre-Anesthesia Assessment:                           - Prior to the procedure, a History and Physical                            was performed, and patient medications and                            allergies were reviewed. The patient's tolerance of                            previous anesthesia was also reviewed. The risks                            and benefits of the procedure and the sedation                            options and risks were discussed with the patient.                            All questions were answered, and informed consent                            was obtained. Prior Anticoagulants: The patient has                            taken no previous anticoagulant or antiplatelet                            agents. ASA Grade Assessment: IV - A patient with                            severe systemic disease that is a constant threat  to life. After reviewing the risks and benefits,                            the patient was deemed in satisfactory condition to                            undergo the procedure.                           After obtaining informed consent, the endoscope was                            passed under direct vision. Throughout the                             procedure, the patient's blood pressure, pulse, and                            oxygen saturations were monitored continuously. The                            [id number]-H190 ([id number]) Olympus endoscope was introduced                            through the mouth, and advanced to the second part                            of duodenum. The upper GI endoscopy was                            accomplished without difficulty. The patient                            tolerated the procedure well. Scope In: Scope Out: Findings:      A medium-sized, Polypoid mass with no bleeding and no stigmata of recent       bleeding was found in the distal esophagus, 39 cm from the incisors. The       mass was non-obstructing. Biopsies were taken with a cold forceps for       histology. GE junction was located around 43 cm.      LA Grade C (one or more mucosal breaks continuous between tops of 2 or       more mucosal folds, less than 75% circumference) esophagitis with no       bleeding was found in the distal esophagus.      A large, infiltrative and ulcerated, partially circumferential       (involving two-thirds of the lumen circumference) mass with no bleeding       and stigmata of recent bleeding was found in the cardia. Biopsies were       taken with a cold forceps for histology.      Scattered mild inflammation characterized by erythema was found in the       gastric body and in the prepyloric region of the stomach.      The duodenal bulb, first portion of the duodenum and second portion of  the duodenum were normal. Impression:               - Rule out malignancy, esophageal tumor was found                            in the distal esophagus. Biopsied.                           - LA Grade C esophagitis with no bleeding.                           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                            Biopsied.                           - Gastritis.                            - Normal duodenal bulb, first portion of the                            duodenum and second portion of the duodenum. Recommendation:           - Perform a colonoscopy today. Procedure Code(s):        --- Professional ---                           [phone], Esophagogastroduodenoscopy, flexible,                            transoral; with biopsy, single or multiple Diagnosis Code(s):        --- Professional ---                           D49.0, Neoplasm of unspecified behavior of                            digestive system                           K20.90, Esophagitis, unspecified without bleeding                           K29.70, Gastritis, unspecified, without bleeding                           D50.9, Iron deficiency anemia, unspecified CPT copyright 2019 [employer]. All rights reserved. The codes documented in this report are preliminary and upon coder review may  be revised to meet current compliance requirements. [name], MD [name], MD 12/31/2021 11:50:09 AM Number of Addenda: 0

## 2021-12-31 NOTE — Anesthesia Preprocedure Evaluation (Signed)
Anesthesia Evaluation  Patient identified by MRN, date of birth, ID band Patient awake    Reviewed: Allergy & Precautions, NPO status , Patient's Chart, lab work & pertinent test results, reviewed documented beta blocker date and time   Airway Mallampati: III  TM Distance: >3 FB Neck ROM: Full    Dental  (+) Dental Advisory Given, Edentulous Upper, Edentulous Lower   Pulmonary Patient abstained from smoking., former smoker,    Pulmonary exam normal breath sounds clear to auscultation       Cardiovascular hypertension, Pt. on home beta blockers and Pt. on medications + CAD, + Past MI, + Cardiac Stents, + Peripheral Vascular Disease and +CHF  Normal cardiovascular exam Rhythm:Regular Rate:Normal  Echo 12/24/21: 1. Left ventricular ejection fraction, by estimation, is 20 to 25%. The  left ventricle has severely decreased function. The left ventricle  demonstrates global hypokinesis. The left ventricular internal cavity size  was mildly dilated. Left ventricular  diastolic parameters are consistent with Grade II diastolic dysfunction  (pseudonormalization). Elevated left atrial pressure. Although there is  global hypokinesis, there is near akinesis and thinning of the myocardium  in the inferior wall, inferior  septum, anterior septum and apex, suggesting infarction in the right  coronary artery and LAD artery territories.  2. Right ventricular systolic function is normal. The right ventricular  size is normal. There is moderately elevated pulmonary artery systolic  pressure.  3. Left atrial size was severely dilated.  4. Right atrial size was mildly dilated.  5. A small pericardial effusion is present. The pericardial effusion is  circumferential. There is no evidence of cardiac tamponade.  6. The mitral valve is normal in structure. Trivial mitral valve  regurgitation.  7. Tricuspid valve regurgitation is moderate.  8. The  aortic valve is tricuspid. Aortic valve regurgitation is not  visualized. No aortic stenosis is present.  9. The inferior vena cava is dilated in size with >50% respiratory  variability, suggesting right atrial pressure of 8 mmHg.    Neuro/Psych negative neurological ROS  negative psych ROS   GI/Hepatic negative GI ROS, Neg liver ROS,   Endo/Other  diabetes, Type 2, Oral Hypoglycemic Agents  Renal/GU Renal InsufficiencyRenal disease     Musculoskeletal negative musculoskeletal ROS (+)   Abdominal   Peds  Hematology  (+) Blood dyscrasia (Thrombocytopenia), anemia ,   Anesthesia Other Findings Day of surgery medications reviewed with the patient.  Reproductive/Obstetrics                             Anesthesia Physical Anesthesia Plan  ASA: 4  Anesthesia Plan: MAC   Post-op Pain Management:    Induction: Intravenous  PONV Risk Score and Plan: 1 and TIVA and Treatment may vary due to age or medical condition  Airway Management Planned: Nasal Cannula and Natural Airway  Additional Equipment:   Intra-op Plan:   Post-operative Plan:   Informed Consent: I have reviewed the patients History and Physical, chart, labs and discussed the procedure including the risks, benefits and alternatives for the proposed anesthesia with the patient or authorized representative who has indicated his/her understanding and acceptance.     Dental advisory given  Plan Discussed with: CRNA  Anesthesia Plan Comments:         Anesthesia Quick Evaluation

## 2022-01-01 DIAGNOSIS — G8929 Other chronic pain: Secondary | ICD-10-CM

## 2022-01-01 DIAGNOSIS — M25561 Pain in right knee: Secondary | ICD-10-CM

## 2022-01-01 LAB — RENAL FUNCTION PANEL
Albumin: 2.3 g/dL — ABNORMAL LOW (ref 3.5–5.0)
Anion gap: 11 (ref 5–15)
BUN: 19 mg/dL (ref 8–23)
CO2: 19 mmol/L — ABNORMAL LOW (ref 22–32)
Calcium: 8.5 mg/dL — ABNORMAL LOW (ref 8.9–10.3)
Chloride: 105 mmol/L (ref 98–111)
Creatinine, Ser: 1.41 mg/dL — ABNORMAL HIGH (ref 0.61–1.24)
GFR, Estimated: 54 mL/min — ABNORMAL LOW (ref >60)
Glucose, Bld: 133 mg/dL — ABNORMAL HIGH (ref 70–99)
Phosphorus: 3.6 mg/dL (ref 2.5–4.6)
Potassium: 3.5 mmol/L (ref 3.5–5.1)
Sodium: 135 mmol/L (ref 135–145)

## 2022-01-01 LAB — GLUCOSE, CAPILLARY
Glucose-Capillary: 132 mg/dL — ABNORMAL HIGH (ref 70–99)
Glucose-Capillary: 130 mg/dL — ABNORMAL HIGH (ref 70–99)
Glucose-Capillary: 152 mg/dL — ABNORMAL HIGH (ref 70–99)

## 2022-01-01 LAB — CBC
HCT: 29.7 % — ABNORMAL LOW (ref 39.0–52.0)
Hemoglobin: 9.7 g/dL — ABNORMAL LOW (ref 13.0–17.0)
MCH: 28.8 pg (ref 26.0–34.0)
MCHC: 32.7 g/dL (ref 30.0–36.0)
MCV: 88.1 fL (ref 80.0–100.0)
Platelets: 154 10*3/uL (ref 150–400)
RBC: 3.37 MIL/uL — ABNORMAL LOW (ref 4.22–5.81)
RDW: 15.3 % (ref 11.5–15.5)
WBC: 13.4 10*3/uL — ABNORMAL HIGH (ref 4.0–10.5)
nRBC: 0 % (ref 0.0–0.2)

## 2022-01-01 LAB — BRAIN NATRIURETIC PEPTIDE: B Natriuretic Peptide: 411 pg/mL — ABNORMAL HIGH (ref 0.0–100.0)

## 2022-01-01 LAB — MAGNESIUM: Magnesium: 2.3 mg/dL (ref 1.7–2.4)

## 2022-01-01 MED ORDER — SPIRONOLACTONE 25 MG PO TABS
12.5000 mg | ORAL_TABLET | Freq: Every day | ORAL | 1 refills | Status: AC
Start: 2022-01-01 — End: ?

## 2022-01-01 MED ORDER — ASPIRIN EC 81 MG PO TBEC: 81.0000 mg | DELAYED_RELEASE_TABLET | Freq: Every day | ORAL | 3 refills | Status: AC

## 2022-01-01 MED ORDER — DICLOFENAC SODIUM 1 % EX GEL: 2.0000 g | Freq: Four times a day (QID) | CUTANEOUS | 0 refills | Status: AC

## 2022-01-01 MED ORDER — PANTOPRAZOLE SODIUM 40 MG PO TBEC: 40.0000 mg | DELAYED_RELEASE_TABLET | Freq: Every day | ORAL | 0 refills | Status: AC

## 2022-01-01 MED ORDER — METFORMIN HCL ER 750 MG PO TB24: 750.0000 mg | ORAL_TABLET | Freq: Every day | ORAL | 1 refills | Status: AC

## 2022-01-01 MED ORDER — POTASSIUM CHLORIDE CRYS ER 20 MEQ PO TBCR
40.0000 meq | EXTENDED_RELEASE_TABLET | Freq: Once | ORAL | Status: AC
  Administered 2022-01-01: 40 meq via ORAL
  Filled 2022-01-01: qty 2

## 2022-01-01 MED ORDER — DAPAGLIFLOZIN PROPANEDIOL 10 MG PO TABS: 10.0000 mg | ORAL_TABLET | Freq: Every day | ORAL | 1 refills | Status: AC

## 2022-01-01 MED ORDER — METOPROLOL SUCCINATE ER 50 MG PO TB24: 50.0000 mg | ORAL_TABLET | Freq: Every day | ORAL | 11 refills | Status: AC

## 2022-01-01 NOTE — Progress Notes (Signed)
SATURATION QUALIFICATIONS: (This note is used to comply with regulatory documentation for home oxygen)  Patient Saturations on Room Air at Rest = 97%  Patient Saturations on Room Air while Ambulating = 94%  Patient Saturations on N/A Liters of oxygen while Ambulating = N/A  Please briefly explain why patient needs home oxygen: N/A

## 2022-01-01 NOTE — TOC Transition Note (Signed)
Transition of Care [facility]) - CM/SW Discharge Note   Patient Details  Name: Alison Breeding MRN: [id number] Date of Birth: 17-May-1951  [facility] CM/SW Contact:  Carles Collet, RN Phone Number: 01/01/2022, 2:05 PM   Clinical Narrative:    Rollator requested 2/16, should be in room. [place] set up with [facility]. No other TOC needs identified    Final next level of care: [place] Barriers to Discharge: No Barriers Identified   Patient Goals and CMS Choice Patient states their goals for this hospitalization and ongoing recovery are:: to go home CMS Medicare.gov Compare Post Acute Care list provided to:: Patient Choice offered to / list presented to : Patient  Discharge Placement                       Discharge Plan and Services   Discharge Planning Services: CM Consult            DME Arranged: Walker rolling with seat DME Agency: [employer] Date DME Agency Contacted: 12/30/21 Time DME Agency Contacted: [id number] Representative spoke with at DME Agency: Sheila Schofield: PT [place]: [place] Date [place]: 01/01/22 Time [place]: [place] Representative spoke with at [place]: [place] ([place]) Interventions     Readmission Risk Interventions No flowsheet data found.

## 2022-01-01 NOTE — Discharge Summary (Signed)
Physician Discharge Summary   Patient: Bob Johnson MRN: [id number] DOB: [birth year]  Admit date:     12/22/2021  Discharge date: 01/01/22  Discharge Physician: Mercy Riding   PCP: Fransisca Connors, FNP   Recommendations at discharge:   Outpatient follow-up with PCP and cardiology in 1 to 2 weeks. Assess BP, fluid status, CBC, renal functions and electrolytes at follow-up. Adjust diuretics and cardiac meds as appropriate GI to follow-up on pathology from EGD. Recommend outpatient follow-up with vascular surgery as previously planned Recommend referral to palliative care outpatient  Discharge Diagnoses: Principal Problem:   Acute on chronic systolic CHF (congestive heart failure) ([place]) Active Problems:   Acute respiratory failure with hypoxia (HCC)   Symptomatic iron deficiency anemia likely due to chronic GI bleed   Esophageal and gastric tumor   Aneurysm of infrarenal abdominal aorta   Chronic kidney disease, stage 3a (HCC)   Gastritis and esophagitis   Type 2 diabetes mellitus without complication, without long-term current use of insulin (HCC)   CAD (coronary artery disease)   Essential hypertension   Mixed hyperlipidemia   PAD (peripheral artery disease) (HCC)   Hypokalemia   Bilateral pleural effusion   Malnutrition of moderate degree   Pressure injury of skin   Right knee pain  Resolved Problems:   Elevated troponin   Hospital Course: 71 year old M with PMH of CAD s/p BMS to LAD in 4132, ICM/systolic CHF, PAD, DM-2, HTN, HLD, penetrating atherosclerotic ulcer of aorta and former smoker presented to PCP on 2/8 with dyspnea and DOE for 2 weeks, decreased appetite and 19 pounds weight loss since 09/2021 and found to be anemic with Hgb of 7.2.  He was directed to ED, and admitted for symptomatic anemia.  Hemoccult positive.  CTA chest/abdomen/pelvis concerning for impending AAA rupture per radiology.  Patient was transferred from [facility] to [facility].  Vascular surgery reviewed  the CTA and not concerned.   Patient developed acute respiratory distress due to decompensated CHF requiring IV Lasix and BiPAP.  Cardiology consulted.  Diuresed with IV Lasix and transition to p.o. Lasix.  Also started on GDMT.   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   Colonoscopy on 2/17 with polyps (resected and retrieved) diverticulosis and internal hemorrhoids.  On the day of discharge, patient was cleared for discharge by gastroenterology and cardiology.  He was ambulated on room air and maintain appropriate saturation although he easily gets fatigued.  Home health PT and rollator ordered as recommended by therapy.   See individual problem list below for more on hospital course.      Assessment and Plan: * Acute on chronic systolic CHF (congestive heart failure) ([place])- (present on admission) TTE with LVEF of 20 to 25% (previously 40%), global hypokinesis, G2 DD.  Could be exacerbated by anemia and blood transfusion.  Started on IV Lasix with improvement in his respiratory symptoms and fluid status.  Transition to p.o. Lasix.  Net -4.6 L.  Clear for discharge on home p.o. Lasix (confirmed with cardiology), Toprol-XL, Entresto, Farxiga and Aldactone.  Counseled on sodium and fluid restriction and daily weight.  Outpatient follow-up with cardiology.   Acute respiratory failure with hypoxia (HCC) Likely due to the above.  Resolved.  Ambulated on room air and maintained appropriate saturation. -Manage CHF as above    Esophageal and gastric tumor Noted on EGD  on 12/31/2021.  Pathology pending. -GI to follow-up on pathology.  Symptomatic iron deficiency anemia likely due to chronic GI bleed- (present on admission) Recent Labs    12/22/21 1941 12/23/21 0530 12/24/21 0645 12/25/21 0205 12/27/21 0981  12/29/21 0147 12/30/21 0235 12/31/21 0132 01/01/22 0207  HGB 7.1* 8.4* 9.9* 10.1* 9.8* 10.0* 9.5* 9.3* 9.7*  -Hgb 13.3 in 02/2021.  Hemoccult positive.  H&H stable after 2 units. -EGD and colonoscopy as above. -Discharged on p.o. Protonix -GI to follow-up on pathology from EGD and colonoscopy.  Gastritis and esophagitis Nonbleeding at the time of EGD. -Continue p.o. Protonix 40 mg daily  Chronic kidney disease, stage 3a ([place])- (present on admission) Recent Labs    12/23/21 0530 12/24/21 0645 12/25/21 0205 12/26/21 0117 12/27/21 1914 12/28/21 0205 12/29/21 0147 12/30/21 0235 12/31/21 0132 01/01/22 0207  BUN 26* 20 21 19 20 21 22 23 22 19   CREATININE 1.24 1.44* 1.47* 1.54* 1.58* 1.53* 1.53* 1.52* 1.46* 1.41*  -Recheck renal function in 1 week.  Aneurysm of infrarenal abdominal aorta- (present on admission) CT report was read as impending AAA rupture.  CT reviewed by vascular surgery.  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  He is not concerned about aortitis.  No surgical intervention indicated. -Outpatient follow-up in 6 months  Type 2 diabetes mellitus without complication, without long-term current use of insulin (HCC) A1c 5.6%. Recent Labs  Lab 12/31/21 1557 12/31/21 2036 01/01/22 0542 01/01/22 0829 01/01/22 1222  GLUCAP 160* 129* 132* 130* 152*  -Decreased home metformin -Started Farxiga -Continue home statin  Right knee pain Likely osteoarthritis.  Exam reassuring. -Voltaren gel and Tylenol as needed -Advised to avoid over-the-counter p.o. NSAID  Pressure injury of skin POA Pressure Injury 12/23/21 Buttocks Stage 2 -  Partial thickness loss of dermis presenting as a shallow open injury with a red, pink wound bed without slough. areas of broken skin on inside of buttocks on right side. (Active)  12/23/21   Location: Buttocks  Location Orientation:   Staging: Stage 2 -  Partial thickness loss of dermis presenting as a shallow open  injury with a red, pink wound bed without slough.  Wound Description (Comments): areas of broken skin on inside of buttocks on right side.  Present on Admission:         Malnutrition of moderate degree As evidenced by significant muscle mass and subcu fat loss and low BMI Nutrition Problem: Moderate Malnutrition Etiology: chronic illness (CHF, CAD) Signs/Symptoms: moderate fat depletion, moderate muscle depletion Interventions: Boost Plus, MVI     Bilateral pleural effusion- (present on admission) Likely due to CHF. -Diuretics as above.  Hypokalemia- (present on admission) K 3.5.  Received p.o. KCl 40x1 prior to discharge  PAD (peripheral artery disease) ([place])- (present on admission) -Continue statins and Zetia. -Patient to resume aspirin in a week.  Changed to enteric-coated  Mixed hyperlipidemia- (present on admission) -Continue with Lipitor and Zetia.  Essential hypertension- (present on admission) -Cardiac meds as above.  CAD (coronary artery disease)- (present on admission) Elevated troponin likely demand ischemia. -Manage CHF and anemia as above.         Pain control - [employer] Controlled Substance Reporting System database was reviewed. and patient was instructed, not to drive, operate heavy machinery, perform activities at heights, swimming or participation in water activities or provide baby-sitting services while on Pain, Sleep and Anxiety Medications; until their outpatient Physician has advised to do so again. Also recommended to not to take  more than prescribed Pain, Sleep and Anxiety Medications.   Consultants: Cardiology, gastroenterology and vascular surgery Procedures performed: EGD and colonoscopy as above Disposition: Home Diet recommendation:  Discharge Diet Orders (From admission, onward)     Start     Ordered   01/01/22 0000  Diet - low sodium heart healthy        01/01/22 1429   01/01/22 0000  Diet Carb Modified        01/01/22  1429           Cardiac and Carb modified diet  DISCHARGE MEDICATION: Allergies as of 01/01/2022   No Known Allergies      Medication List     STOP taking these medications    aspirin 81 MG chewable tablet Replaced by: aspirin EC 81 MG tablet       TAKE these medications    aspirin EC 81 MG tablet Take 1 tablet (81 mg total) by mouth daily. Swallow whole. Start taking on: January 08, 2022 Replaces: aspirin 81 MG chewable tablet   atorvastatin 80 MG tablet Commonly known as: LIPITOR Take 80 mg by mouth daily.   dapagliflozin propanediol 10 MG Tabs tablet Commonly known as: FARXIGA Take 1 tablet (10 mg total) by mouth daily.   diclofenac Sodium 1 % Gel Commonly known as: VOLTAREN Apply 2 g topically 4 (four) times daily.   Entresto 24-26 MG Generic drug: sacubitril-valsartan 1 tablet 2 (two) times daily.   ezetimibe 10 MG tablet Commonly known as: ZETIA Take 10 mg by mouth daily.   furosemide 40 MG tablet Commonly known as: LASIX Take 80 mg by mouth daily.   metFORMIN 750 MG 24 hr tablet Commonly known as: GLUCOPHAGE-XR Take 1 tablet (750 mg total) by mouth daily with breakfast. What changed:  how to take this when to take this   metoprolol succinate 50 MG 24 hr tablet Commonly known as: TOPROL-XL Take 1 tablet (50 mg total) by mouth daily. What changed: how much to take   multivitamin capsule Take 1 capsule by mouth daily.   nitroGLYCERIN 0.4 MG SL tablet Commonly known as: NITROSTAT Place 0.4 mg under the tongue every 5 (five) minutes as needed for chest pain.   pantoprazole 40 MG tablet Commonly known as: PROTONIX Take 1 tablet (40 mg total) by mouth daily.   potassium chloride 10 MEQ tablet Commonly known as: KLOR-CON M Take 10 mEq by mouth daily.   spironolactone 25 MG tablet Commonly known as: ALDACTONE Take 0.5 tablets (12.5 mg total) by mouth daily.               Durable Medical Equipment  (From admission, onward)            Start     Ordered   12/30/21 0719  For home use only DME 4 wheeled rolling walker with seat  Once       Question:  Patient needs a walker to treat with the following condition  Answer:  Generalized weakness   12/30/21 0719            Follow-up Information     [facility] Follow up in 6 month(s).   Specialty: Vascular Surgery Contact information: [address] [place] [facility] Follow up.   Why: ([employer])- rollator arranged- to be delivered to room prior to discharge Contact information: [place] [address] [phone]  Fransisca Connors, [place]. Schedule an appointment as soon as possible for a visit in 1 week(s).   Specialty: Family Medicine Contact information: [place] [address] [phone]         Lelon Perla, MD Follow up in 2 week(s).   Specialty: Cardiology Contact information: [address] [place] [address] [place], [facility] Follow up.   Specialty: Home Health Services Contact information: [place] [address] [phone]                 Discharge Exam: Filed Weights   12/30/21 0445 12/31/21 0354 01/01/22 0500  Weight: 71.9 kg 71.8 kg 74.7 kg   Vitals:   01/01/22 0500 01/01/22 0755 01/01/22 1020 01/01/22 1300  BP:  108/60 (!) 113/57 (!) 103/55  Pulse:  86    Resp:  20 20   Temp:  97.8 F (36.6 C)    TempSrc:  Oral    SpO2:  96% 96% 96%  Weight: 74.7 kg     Height:         Condition at discharge: fair  The results of significant diagnostics from this hospitalization (including imaging, microbiology, ancillary and laboratory) are listed below for reference.   Imaging Studies: DG Chest 2 View  Result Date: 12/25/2021 CLINICAL DATA:  Acute congestive heart failure. EXAM: CHEST - 2  VIEW COMPARISON:  12/23/2021. FINDINGS: Since the prior study there has been no convincing change allowing for differences in patient positioning and technique. There are persistent opacities that extend from the perihilar regions to the lung bases, obscuring hemidiaphragms. Lung opacities are associated with moderate bilateral pleural effusions. There is also bilateral vascular congestion and interstitial thickening. No new lung abnormalities.  No pneumothorax. IMPRESSION: 1. No significant change from the exam is dated 12/23/2021. 2. Persistent lung opacities including vascular congestion and interstitial thickening, consistent with combination interstitial pulmonary edema, lung base atelectasis and moderate effusions. Electronically Signed   By: Lajean Manes M.D.   On: 12/25/2021 08:42   DG CHEST PORT 1 VIEW  Result Date: 12/23/2021 CLINICAL DATA:  Tachypnea EXAM: PORTABLE CHEST 1 VIEW COMPARISON:  CT 12/23/2021 FINDINGS: Cardiomegaly with vascular congestion and diffuse interstitial and ground-glass opacities suspicious for pulmonary edema. Small moderate bilateral effusions. Airspace disease at the bases. No pneumothorax. IMPRESSION: 1. Cardiomegaly with vascular congestion and diffuse interstitial and ground-glass opacity suspicious for pulmonary edema. Small-moderate bilateral effusions with basilar airspace disease Electronically Signed   By: Donavan Foil M.D.   On: 12/23/2021 17:02   ECHOCARDIOGRAM COMPLETE  Result Date: 12/24/2021    ECHOCARDIOGRAM REPORT   Patient Name:   EMBER GOTTWALD Date of Exam: 12/24/2021 Medical Rec #:  [id number]    Height:       73.0 in Accession #:    [id number]   Weight:       169.8 lb Date of Birth:  03-25-1951    BSA:          2.007 m Patient Age:    71 years     BP:           108/69 mmHg Patient Gender: M            HR:           101 bpm. Exam Location:  Inpatient Procedure: 2D Echo, Cardiac Doppler, Color Doppler and Intracardiac  Opacification Agent  Indications:    CHF  History:        Patient has no prior history of Echocardiogram examinations.                 CAD; Risk Factors:Hypertension and Diabetes.  Sonographer:    Jyl Heinz Referring Phys: [place]  1. Left ventricular ejection fraction, by estimation, is 20 to 25%. The left ventricle has severely decreased function. The left ventricle demonstrates global hypokinesis. The left ventricular internal cavity size was mildly dilated. Left ventricular diastolic parameters are consistent with Grade II diastolic dysfunction (pseudonormalization). Elevated left atrial pressure. 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  2. Right ventricular systolic function is normal. The right ventricular size is normal. There is moderately elevated pulmonary artery systolic pressure.  3. Left atrial size was severely dilated.  4. Right atrial size was mildly dilated.  5. A small pericardial effusion is present. The pericardial effusion is circumferential. There is no evidence of cardiac tamponade.  6. The mitral valve is normal in structure. Trivial mitral valve regurgitation.  7. Tricuspid valve regurgitation is moderate.  8. The aortic valve is tricuspid. Aortic valve regurgitation is not visualized. No aortic stenosis is present.  9. The inferior vena cava is dilated in size with >50% respiratory variability, suggesting right atrial pressure of 8 mmHg. FINDINGS  Left Ventricle: No left ventricular thrombus is seen (definity contrast used). Left ventricular ejection fraction, by estimation, is 20 to 25%. The left ventricle has severely decreased function. The left ventricle demonstrates global hypokinesis. The left ventricular internal cavity size was mildly dilated. There is no left ventricular  hypertrophy. Left ventricular diastolic parameters are consistent with Grade II diastolic dysfunction (pseudonormalization). Elevated left atrial pressure.  LV Wall Scoring: The inferior septum, entire inferior wall, basal anteroseptal segment, apical anterior segment, and apex are akinetic. The anterior wall, entire lateral wall, and mid anteroseptal segment are hypokinetic. 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 Right Ventricle: The right ventricular size is normal. No increase in right ventricular wall thickness. Right ventricular systolic function is normal. There is moderately elevated pulmonary artery systolic pressure. The tricuspid regurgitant velocity is 3.27 m/s, and with an assumed right atrial pressure of 8 mmHg, the estimated right ventricular systolic pressure is 78.9 mmHg. Left Atrium: Left atrial size was severely dilated. Right Atrium: Right atrial size was mildly dilated. Pericardium: A small pericardial effusion is present. The pericardial effusion is circumferential. There is no evidence of cardiac tamponade. Mitral Valve: The mitral valve is normal in structure. Trivial mitral valve regurgitation. Tricuspid Valve: The tricuspid valve is normal in structure. Tricuspid valve regurgitation is moderate. Aortic Valve: The aortic valve is tricuspid. Aortic valve regurgitation is not visualized. No aortic stenosis is present. Aortic valve peak gradient measures 7.1 mmHg. Pulmonic Valve: The pulmonic valve was grossly normal. Pulmonic valve regurgitation is not visualized. Aorta: The aortic root and ascending aorta are structurally normal, with no evidence of dilitation. Venous: The inferior vena cava is dilated in size with greater than 50% respiratory variability, suggesting right atrial pressure of 8 mmHg.  IAS/Shunts: No atrial level shunt detected by color flow  Doppler.  LEFT VENTRICLE PLAX 2D LVIDd:         5.90 cm      Diastology LVIDs:         5.00 cm      LV e' medial:    9.64 cm/s LV PW:         1.00 cm      LV E/e' medial:  9.2 LV IVS:        0.70 cm      LV e' lateral:   11.20 cm/s LVOT diam:     2.00 cm      LV E/e' lateral: 7.9 LV SV:         46 LV SV Index:   23 LVOT Area:     3.14 cm  LV Volumes (MOD) LV vol d, MOD A2C: 237.0 ml LV vol d, MOD A4C: 198.0 ml LV vol s, MOD A2C: 174.0 ml LV vol s, MOD A4C: 146.0 ml LV SV MOD A2C:     63.0 ml LV SV MOD A4C:     198.0 ml LV SV MOD BP:      59.3 ml RIGHT VENTRICLE             IVC RV Basal diam:  3.60 cm     IVC diam: 2.20 cm RV Mid diam:    2.70 cm RV S prime:     13.40 cm/s TAPSE (M-mode): 1.9 cm LEFT ATRIUM             Index        RIGHT ATRIUM           Index LA diam:        3.90 cm 1.94 cm/m   RA Area:     18.00 cm LA Vol (A2C):   59.3 ml 29.55 ml/m  RA Volume:   48.90 ml  24.37 ml/m LA Vol (A4C):   65.2 ml 32.49 ml/m LA Biplane Vol: 66.7 ml 33.24 ml/m  AORTIC VALVE AV Area (Vmax): 2.26 cm AV Vmax:        133.00 cm/s AV Peak Grad:   7.1 mmHg LVOT Vmax:      95.80 cm/s LVOT Vmean:     61.900 cm/s LVOT VTI:       0.146 m  AORTA Ao Root diam: 3.60 cm Ao Asc diam:  2.80 cm MITRAL VALVE               TRICUSPID VALVE MV Area (PHT): 4.99 cm    TR Peak grad:   42.8 mmHg MV Decel Time: 152 msec    TR Vmax:        327.00 cm/s MV E velocity: 88.60 cm/s MV A velocity: 64.50 cm/s  SHUNTS MV E/A ratio:  1.37        Systemic VTI:  0.15 m                            Systemic Diam: 2.00 cm Dani Gobble Croitoru MD Electronically signed by Sanda Klein MD Signature Date/Time: 12/24/2021/12:42:44 PM    Final    CT Angio Chest/Abd/Pel for Dissection W and/or W/WO  Result Date: 12/23/2021 CLINICAL DATA:  Aortic dissection, clinical status change EXAM: CT ANGIOGRAPHY CHEST, ABDOMEN AND PELVIS TECHNIQUE: Non-contrast CT of the chest was initially obtained. Multidetector CT  imaging through the chest, abdomen and pelvis was performed using the standard protocol during bolus administration of intravenous contrast. Multiplanar reconstructed images and  MIPs were obtained and reviewed to evaluate the vascular anatomy. RADIATION DOSE REDUCTION: This exam was performed according to the departmental dose-optimization program which includes automated exposure control, adjustment of the mA and/or kV according to patient size and/or use of iterative reconstruction technique. CONTRAST:  139mL OMNIPAQUE IOHEXOL 350 MG/ML SOLN COMPARISON:  None. FINDINGS: CTA CHEST FINDINGS Cardiovascular: Normal cardiac size. Trace pericardial fluid. Normal size main and branch pulmonary arteries. No evidence of pulmonary embolism. Motion artifact in the ascending aorta. There is moderate calcified atherosclerosis of the aortic arch. 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 This is not in association with an aberrant subclavian artery. No evidence of thoracic aortic aneurysm, dissection, or intramural hematoma. There are multiple prominent venous collateral vessels in the chest primarily on the right, suggestive of possible right brachiocephalic/subclavian vein narrowing, possibly as it passes under the clavicle. Mediastinum/Nodes: There are multiple enlarged mediastinal and hilar lymph nodes. For reference, a prevascular lymph node measures 1.3 cm (series 7, image 45). Left paratracheal lymph node measures 1.1 cm (series 7, image 48). Lungs/Pleura: Moderate right and small left pleural effusions with adjacent atelectasis. Diffuse bronchial wall thickening, severe in the lower lungs. Diffuse interlobular septal thickening and ground-glass. Moderate centrilobular and paraseptal emphysema. Biapical pleuroparenchymal scarring. No visible pulmonary nodule. Musculoskeletal: No acute osseous abnormality. No suspicious  lytic or blastic lesion. There is an age-indeterminate anterior compression deformity of T8 with a proximally 20% height loss. Review of the MIP images confirms the above findings. CTA ABDOMEN AND PELVIS FINDINGS VASCULAR Aorta: There is an infrarenal abdominal aortic aneurysm with lobular mural thrombus. This measures at maximum 3.8 x 3.7 cm (series 7, image 141). The mural thrombus results in mild-to-moderate focal stenosis. There is hyperdense thickening of the aortic wall (series 7, image 140). There is possible mild adjacent stranding. Celiac: Patent without stenosis. SMA: Patent without significant stenosis. Renals: Patent without significant stenosis. There are 2 small left renal arteries. IMA: Patent with mild-to-moderate stenosis at the IMA ostia which originates at the level of the aortic aneurysm. Inflow: Patent without significant stenosis. Veins: No obvious venous abnormality within the limitations of this arterial phase study. Review of the MIP images confirms the above findings. NON-VASCULAR Hepatobiliary: No focal liver abnormality is seen. No gallstones, gallbladder wall thickening, or biliary dilatation. Pancreas: Unremarkable. No pancreatic ductal dilatation or surrounding inflammatory changes. Spleen: Normal in size without focal abnormality. Adrenals/Urinary Tract: There is bilateral adrenal gland thickening, left worse than right. Probable 1.4 cm left adrenal adenoma (coronal image 90). No hydronephrosis or nephrolithiasis. The bladder is well distended but otherwise unremarkable. Stomach/Bowel: The stomach is within normal limits. There is no evidence of bowel obstruction.The appendix is normal. There is nonspecific mesenteric haziness. Lymphatic: There are multiple enlarged retroperitoneal lymph nodes. For reference: Left periaortic lymph nodes measures 1.2 cm and 1.0 cm (series 7, images 131 and 132). Reproductive: Mildly enlarged prostate. Other: There is a small fat containing left  inguinal hernia. Musculoskeletal: No acute or significant osseous findings. Review of the MIP images confirms the above findings. IMPRESSION: Infrarenal abdominal aortic aneurysm measuring up to 3.8 x 3.7 cm, with significant mural thrombus. There is hyperdense aortic wall thickening, concerning for a high attenuation crescent sign, which is a sign of impending AAA rupture, although rupture would be atypical at this size of aneurysm. There is adjacent mild periaortic stranding and retroperitoneal adenopathy, suggesting this could alternatively represent aortitis.  Recommend vascular surgery consultation and correlation with inflammatory markers. Correlation with prior imaging, if available, would be useful. Moderate pulmonary edema with moderate right and small left pleural effusions and adjacent basilar atelectasis. Prominent mediastinal and hilar lymph nodes, which are favored to be reactive. Attention on follow-up exam. Age-indeterminate anterior compression deformity of T8 with approximately 20% height loss. These results were called by telephone at the time of interpretation on 12/23/2021 at 12:54 pm to provider [facility] , who verbally acknowledged these results. Electronically Signed   By: Maurine Simmering M.D.   On: 12/23/2021 12:58    Microbiology: Results for orders placed or performed during the hospital encounter of 12/22/21  Resp Panel by RT-PCR (Flu A&B, Covid) Nasopharyngeal Swab     Status: None   Collection Time: 12/22/21  9:45 PM   Specimen: Nasopharyngeal Swab; Nasopharyngeal(NP) swabs in vial transport medium  Result Value Ref Range Status   SARS Coronavirus 2 by RT PCR NEGATIVE NEGATIVE Final    Comment: (NOTE) SARS-CoV-2 target nucleic acids are NOT DETECTED.  The SARS-CoV-2 RNA is generally detectable in upper respiratory specimens during the acute phase of infection. The lowest concentration of SARS-CoV-2 viral copies this assay can detect is 138 copies/mL. A negative result does  not preclude SARS-Cov-2 infection and should not be used as the sole basis for treatment or other patient management decisions. A negative result may occur with  improper specimen collection/handling, submission of specimen other than nasopharyngeal swab, presence of viral mutation(s) within the areas targeted by this assay, and inadequate number of viral copies(<138 copies/mL). A negative result must be combined with clinical observations, patient history, and epidemiological information. The expected result is Negative.  Fact Sheet for Patients:  EntrepreneurPulse.com.au  Fact Sheet for Healthcare Providers:  IncredibleEmployment.be  This test is no t yet approved or cleared by the [place] FDA and  has been authorized for detection and/or diagnosis of SARS-CoV-2 by FDA under an Emergency Use Authorization (EUA). This EUA will remain  in effect (meaning this test can be used) for the duration of the COVID-19 declaration under Section 564(b)(1) of the Act, 21 U.S.C.section 360bbb-3(b)(1), unless the authorization is terminated  or revoked sooner.       Influenza A by PCR NEGATIVE NEGATIVE Final   Influenza B by PCR NEGATIVE NEGATIVE Final    Comment: (NOTE) The Xpert Xpress SARS-CoV-2/FLU/RSV plus assay is intended as an aid in the diagnosis of influenza from Nasopharyngeal swab specimens and should not be used as a sole basis for treatment. Nasal washings and aspirates are unacceptable for Xpert Xpress SARS-CoV-2/FLU/RSV testing.  Fact Sheet for Patients: EntrepreneurPulse.com.au  Fact Sheet for Healthcare Providers: IncredibleEmployment.be  This test is not yet approved or cleared by the [place] FDA and has been authorized for detection and/or diagnosis of SARS-CoV-2 by FDA under an Emergency Use Authorization (EUA). This EUA will remain in effect (meaning this test can be used) for the  duration of the COVID-19 declaration under Section 564(b)(1) of the Act, 21 U.S.C. section 360bbb-3(b)(1), unless the authorization is terminated or revoked.  Performed at [facility], [place] [address], [place], [place] [address]     Labs: CBC: Recent Labs  Lab 12/27/21 0620 12/29/21 0147 12/30/21 0235 12/31/21 0132 01/01/22 0207  WBC 11.5* 12.7* 12.9* 12.6* 13.4*  HGB 9.8* 10.0* 9.5* 9.3* 9.7*  HCT 31.4* 31.1* 30.8* 30.0* 29.7*  MCV 91.3 89.4 89.0 89.3 88.1  PLT 163 155 149* 149* 154   Basic  Metabolic Panel: Recent Labs  Lab 12/26/21 0117 12/27/21 0620 12/28/21 0205 12/29/21 0147 12/30/21 0235 12/31/21 0132 01/01/22 0207  NA 141   < > 139 138 136 136 135  K 3.7   < > 3.9 3.5 3.6 3.7 3.5  CL 107   < > 107 106 104 105 105  CO2 22   < > 21* 19* 20* 20* 19*  GLUCOSE 129*   < > 118* 111* 126* 127* 133*  BUN 19   < > 21 22 23 22 19   CREATININE 1.54*   < > 1.53* 1.53* 1.52* 1.46* 1.41*  CALCIUM 8.4*   < > 8.5* 8.3* 8.5* 8.5* 8.5*  MG 1.9  --  2.5*  --  2.4 2.3 2.3  PHOS  --   --   --   --  3.2 3.5 3.6   < > = values in this interval not displayed.   Liver Function Tests: Recent Labs  Lab 12/30/21 0235 12/31/21 0132 01/01/22 0207  ALBUMIN 2.5* 2.4* 2.3*   CBG: Recent Labs  Lab 12/31/21 1557 12/31/21 2036 01/01/22 0542 01/01/22 0829 01/01/22 1222  GLUCAP 160* 129* 132* 130* 152*    Discharge time spent: greater than 30 minutes.  Signed: Mercy Riding, MD [facility] 01/01/2022

## 2022-01-01 NOTE — Progress Notes (Signed)
Mobility Specialist Progress Note   01/01/22 1220  Orthostatic Lying   BP- Lying 108/51  Orthostatic Sitting  BP- Sitting 101/56  Orthostatic Standing at 0 minutes  BP- Standing at 0 minutes 95/50  Orthostatic Standing at 3 minutes  BP- Standing at 3 minutes 103/55  Mobility  Activity Ambulated with assistance in hallway;Ambulated with assistance in room  Level of Assistance Minimal assist, patient does 75% or more  Assistive Device Four wheel walker  Distance Ambulated (ft) 34 ft  Activity Response Tolerated fair  $Mobility charge 1 Mobility   Session limited today by pt's endurance and fatigue. Pt requiring inc time when mobilizing today but requiring no assist. Able to demonstrate proper rollator mechanics but x4 seated breaks d/t fatigue and SOB, SpO2 was >94% throughout. Pt reported no pain during ambulation just "tiredness", results in premature unadvised sitting. Returned back to bed w/ no further complaint and call bell in reach.     Pre Mobility: 73 HR, 97% SpO2 on RA  During Mobility: 102 HR, 94% SpO2 on RA  Post Mobility: 88 HR, 96% SpO2 on RA   Holland Falling Mobility Specialist Phone Number [phone]

## 2022-01-01 NOTE — Progress Notes (Signed)
Pt discharged to home with family.  Pt's IVs removed.  Pt taken off telemetry and CCMD notified.  Pt left with all of their personal belongings.  Pt left with Rollator.  AVS documentation reviewed and sent home with Pt and wife and all questions answered.

## 2022-01-01 NOTE — Anesthesia Postprocedure Evaluation (Signed)
Anesthesia Post Note  Patient: Civil engineer, contracting  Procedure(s) Performed: COLONOSCOPY ESOPHAGOGASTRODUODENOSCOPY (EGD) BIOPSY POLYPECTOMY     Patient location during evaluation: Endoscopy Anesthesia Type: MAC Level of consciousness: oriented, awake and alert and awake Pain management: pain level controlled Vital Signs Assessment: post-procedure vital signs reviewed and stable Respiratory status: spontaneous breathing, nonlabored ventilation, respiratory function stable and patient connected to nasal cannula oxygen Cardiovascular status: blood pressure returned to baseline and stable Postop Assessment: no headache, no backache and no apparent nausea or vomiting Anesthetic complications: no   No notable events documented.  Last Vitals:  Vitals:   12/31/21 2031 01/01/22 0447  BP: (!) 110/58 (!) 106/54  Pulse: 92 97  Resp: 17 17  Temp: 36.8 C (!) 36.4 C  SpO2:  96%    Last Pain:  Vitals:   01/01/22 0447  TempSrc: Oral  PainSc:    Pain Goal:                   Santa Lighter

## 2022-01-01 NOTE — Progress Notes (Signed)
°   01/01/22 1000   PT - Assessment/Plan  Follow Up Recommendations Home health PT  Assistance recommended at discharge Intermittent Supervision/Assistance  Patient can return home with the following A little help with walking and/or transfers;Help with stairs or ramp for entrance   Nurse asked PT if pt needed HHPT f/u.  Upon reviewing chart appears that pts' right knee has had some incr pain and limiting pt's incr distance with gait.  Feel that a few sessions of HHPT may be beneficial at home to address safety in the home.  Thanks.   Traylon Schimming M,PT Acute Rehab Services [phone] (pager)

## 2022-01-02 ENCOUNTER — Encounter (HOSPITAL_COMMUNITY): Payer: Self-pay | Admitting: Gastroenterology

## 2022-01-03 LAB — SURGICAL PATHOLOGY

## 2022-01-07 ENCOUNTER — Inpatient Hospital Stay (HOSPITAL_COMMUNITY)
Admission: EM | Admit: 2022-01-07 | Discharge: 2022-01-12 | DRG: 871 | Disposition: E | Payer: Medicare Other | Attending: Family Medicine | Admitting: Family Medicine

## 2022-01-07 ENCOUNTER — Emergency Department (HOSPITAL_COMMUNITY): Payer: Medicare Other

## 2022-01-07 ENCOUNTER — Other Ambulatory Visit: Payer: Self-pay

## 2022-01-07 ENCOUNTER — Inpatient Hospital Stay (HOSPITAL_COMMUNITY): Payer: Medicare Other

## 2022-01-07 ENCOUNTER — Encounter (HOSPITAL_COMMUNITY): Payer: Self-pay

## 2022-01-07 DIAGNOSIS — Z66 Do not resuscitate: Secondary | ICD-10-CM | POA: Diagnosis present

## 2022-01-07 DIAGNOSIS — C169 Malignant neoplasm of stomach, unspecified: Secondary | ICD-10-CM | POA: Diagnosis present

## 2022-01-07 DIAGNOSIS — I5041 Acute combined systolic (congestive) and diastolic (congestive) heart failure: Secondary | ICD-10-CM

## 2022-01-07 DIAGNOSIS — A419 Sepsis, unspecified organism: Secondary | ICD-10-CM | POA: Diagnosis not present

## 2022-01-07 DIAGNOSIS — I252 Old myocardial infarction: Secondary | ICD-10-CM

## 2022-01-07 DIAGNOSIS — J9601 Acute respiratory failure with hypoxia: Secondary | ICD-10-CM | POA: Diagnosis present

## 2022-01-07 DIAGNOSIS — J189 Pneumonia, unspecified organism: Secondary | ICD-10-CM | POA: Diagnosis present

## 2022-01-07 DIAGNOSIS — E872 Acidosis, unspecified: Secondary | ICD-10-CM | POA: Diagnosis present

## 2022-01-07 DIAGNOSIS — I255 Ischemic cardiomyopathy: Secondary | ICD-10-CM | POA: Diagnosis present

## 2022-01-07 DIAGNOSIS — E1140 Type 2 diabetes mellitus with diabetic neuropathy, unspecified: Secondary | ICD-10-CM | POA: Diagnosis present

## 2022-01-07 DIAGNOSIS — Z955 Presence of coronary angioplasty implant and graft: Secondary | ICD-10-CM

## 2022-01-07 DIAGNOSIS — N179 Acute kidney failure, unspecified: Secondary | ICD-10-CM | POA: Diagnosis present

## 2022-01-07 DIAGNOSIS — E860 Dehydration: Secondary | ICD-10-CM | POA: Diagnosis present

## 2022-01-07 DIAGNOSIS — I2510 Atherosclerotic heart disease of native coronary artery without angina pectoris: Secondary | ICD-10-CM | POA: Diagnosis not present

## 2022-01-07 DIAGNOSIS — R451 Restlessness and agitation: Secondary | ICD-10-CM | POA: Diagnosis not present

## 2022-01-07 DIAGNOSIS — E7800 Pure hypercholesterolemia, unspecified: Secondary | ICD-10-CM

## 2022-01-07 DIAGNOSIS — R54 Age-related physical debility: Secondary | ICD-10-CM | POA: Diagnosis present

## 2022-01-07 DIAGNOSIS — I5043 Acute on chronic combined systolic (congestive) and diastolic (congestive) heart failure: Secondary | ICD-10-CM | POA: Diagnosis present

## 2022-01-07 DIAGNOSIS — I2583 Coronary atherosclerosis due to lipid rich plaque: Secondary | ICD-10-CM

## 2022-01-07 DIAGNOSIS — K219 Gastro-esophageal reflux disease without esophagitis: Secondary | ICD-10-CM | POA: Diagnosis present

## 2022-01-07 DIAGNOSIS — Z87891 Personal history of nicotine dependence: Secondary | ICD-10-CM

## 2022-01-07 DIAGNOSIS — I3139 Other pericardial effusion (noninflammatory): Secondary | ICD-10-CM | POA: Diagnosis present

## 2022-01-07 DIAGNOSIS — I2582 Chronic total occlusion of coronary artery: Secondary | ICD-10-CM | POA: Diagnosis present

## 2022-01-07 DIAGNOSIS — I4510 Unspecified right bundle-branch block: Secondary | ICD-10-CM | POA: Diagnosis present

## 2022-01-07 DIAGNOSIS — Z8249 Family history of ischemic heart disease and other diseases of the circulatory system: Secondary | ICD-10-CM

## 2022-01-07 DIAGNOSIS — J9600 Acute respiratory failure, unspecified whether with hypoxia or hypercapnia: Secondary | ICD-10-CM | POA: Diagnosis present

## 2022-01-07 DIAGNOSIS — E1151 Type 2 diabetes mellitus with diabetic peripheral angiopathy without gangrene: Secondary | ICD-10-CM | POA: Diagnosis present

## 2022-01-07 DIAGNOSIS — I110 Hypertensive heart disease with heart failure: Secondary | ICD-10-CM | POA: Diagnosis present

## 2022-01-07 DIAGNOSIS — Z79899 Other long term (current) drug therapy: Secondary | ICD-10-CM

## 2022-01-07 DIAGNOSIS — E785 Hyperlipidemia, unspecified: Secondary | ICD-10-CM | POA: Diagnosis present

## 2022-01-07 DIAGNOSIS — Z515 Encounter for palliative care: Secondary | ICD-10-CM

## 2022-01-07 DIAGNOSIS — I7143 Infrarenal abdominal aortic aneurysm, without rupture: Secondary | ICD-10-CM | POA: Diagnosis present

## 2022-01-07 DIAGNOSIS — R778 Other specified abnormalities of plasma proteins: Secondary | ICD-10-CM

## 2022-01-07 DIAGNOSIS — I248 Other forms of acute ischemic heart disease: Secondary | ICD-10-CM | POA: Diagnosis present

## 2022-01-07 DIAGNOSIS — Z7984 Long term (current) use of oral hypoglycemic drugs: Secondary | ICD-10-CM

## 2022-01-07 DIAGNOSIS — Z20822 Contact with and (suspected) exposure to covid-19: Secondary | ICD-10-CM | POA: Diagnosis present

## 2022-01-07 DIAGNOSIS — R6521 Severe sepsis with septic shock: Secondary | ICD-10-CM | POA: Diagnosis present

## 2022-01-07 DIAGNOSIS — Z7982 Long term (current) use of aspirin: Secondary | ICD-10-CM

## 2022-01-07 DIAGNOSIS — E861 Hypovolemia: Secondary | ICD-10-CM | POA: Diagnosis present

## 2022-01-07 DIAGNOSIS — C160 Malignant neoplasm of cardia: Secondary | ICD-10-CM | POA: Diagnosis present

## 2022-01-07 DIAGNOSIS — I9589 Other hypotension: Secondary | ICD-10-CM

## 2022-01-07 LAB — CBC WITH DIFFERENTIAL/PLATELET
Abs Immature Granulocytes: 0.29 10*3/uL — ABNORMAL HIGH (ref 0.00–0.07)
Basophils Absolute: 0 10*3/uL (ref 0.0–0.1)
Basophils Relative: 0 %
Eosinophils Absolute: 0 10*3/uL (ref 0.0–0.5)
Eosinophils Relative: 0 %
HCT: 31 % — ABNORMAL LOW (ref 39.0–52.0)
Hemoglobin: 9.8 g/dL — ABNORMAL LOW (ref 13.0–17.0)
Immature Granulocytes: 1 %
Lymphocytes Relative: 1 %
Lymphs Abs: 0.3 10*3/uL — ABNORMAL LOW (ref 0.7–4.0)
MCH: 27.6 pg (ref 26.0–34.0)
MCHC: 31.6 g/dL (ref 30.0–36.0)
MCV: 87.3 fL (ref 80.0–100.0)
Monocytes Absolute: 1.6 10*3/uL — ABNORMAL HIGH (ref 0.1–1.0)
Monocytes Relative: 6 %
Neutro Abs: 26.5 10*3/uL — ABNORMAL HIGH (ref 1.7–7.7)
Neutrophils Relative %: 92 %
Platelets: 130 10*3/uL — ABNORMAL LOW (ref 150–400)
RBC: 3.55 MIL/uL — ABNORMAL LOW (ref 4.22–5.81)
RDW: 15.4 % (ref 11.5–15.5)
WBC: 28.8 10*3/uL — ABNORMAL HIGH (ref 4.0–10.5)
nRBC: 0 % (ref 0.0–0.2)

## 2022-01-07 LAB — BASIC METABOLIC PANEL
Anion gap: 18 — ABNORMAL HIGH (ref 5–15)
BUN: 37 mg/dL — ABNORMAL HIGH (ref 8–23)
CO2: 11 mmol/L — ABNORMAL LOW (ref 22–32)
Calcium: 8.5 mg/dL — ABNORMAL LOW (ref 8.9–10.3)
Chloride: 111 mmol/L (ref 98–111)
Creatinine, Ser: 2.04 mg/dL — ABNORMAL HIGH (ref 0.61–1.24)
GFR, Estimated: 34 mL/min — ABNORMAL LOW (ref >60)
Glucose, Bld: 158 mg/dL — ABNORMAL HIGH (ref 70–99)
Potassium: 4.6 mmol/L (ref 3.5–5.1)
Sodium: 140 mmol/L (ref 135–145)

## 2022-01-07 LAB — TROPONIN I (HIGH SENSITIVITY)
Troponin I (High Sensitivity): 5581 ng/L (ref <18)
Troponin I (High Sensitivity): 7142 ng/L (ref <18)

## 2022-01-07 LAB — PROTIME-INR
INR: 1.4 — ABNORMAL HIGH (ref 0.8–1.2)
Prothrombin Time: 17.4 seconds — ABNORMAL HIGH (ref 11.4–15.2)

## 2022-01-07 LAB — COMPREHENSIVE METABOLIC PANEL
ALT: 35 U/L (ref 0–44)
AST: 41 U/L (ref 15–41)
Albumin: 2.2 g/dL — ABNORMAL LOW (ref 3.5–5.0)
Alkaline Phosphatase: 82 U/L (ref 38–126)
Anion gap: 14 (ref 5–15)
BUN: 36 mg/dL — ABNORMAL HIGH (ref 8–23)
CO2: 17 mmol/L — ABNORMAL LOW (ref 22–32)
Calcium: 8.5 mg/dL — ABNORMAL LOW (ref 8.9–10.3)
Chloride: 106 mmol/L (ref 98–111)
Creatinine, Ser: 2.21 mg/dL — ABNORMAL HIGH (ref 0.61–1.24)
GFR, Estimated: 31 mL/min — ABNORMAL LOW (ref >60)
Glucose, Bld: 167 mg/dL — ABNORMAL HIGH (ref 70–99)
Potassium: 3.9 mmol/L (ref 3.5–5.1)
Sodium: 137 mmol/L (ref 135–145)
Total Bilirubin: 0.5 mg/dL (ref 0.3–1.2)
Total Protein: 6.5 g/dL (ref 6.5–8.1)

## 2022-01-07 LAB — CBC
HCT: 31.4 % — ABNORMAL LOW (ref 39.0–52.0)
Hemoglobin: 9.4 g/dL — ABNORMAL LOW (ref 13.0–17.0)
MCH: 28 pg (ref 26.0–34.0)
MCHC: 29.9 g/dL — ABNORMAL LOW (ref 30.0–36.0)
MCV: 93.5 fL (ref 80.0–100.0)
Platelets: 107 10*3/uL — ABNORMAL LOW (ref 150–400)
RBC: 3.36 MIL/uL — ABNORMAL LOW (ref 4.22–5.81)
RDW: 15.8 % — ABNORMAL HIGH (ref 11.5–15.5)
WBC: 28.5 10*3/uL — ABNORMAL HIGH (ref 4.0–10.5)
nRBC: 0 % (ref 0.0–0.2)

## 2022-01-07 LAB — LACTIC ACID, PLASMA
Lactic Acid, Venous: 1.9 mmol/L (ref 0.5–1.9)
Lactic Acid, Venous: 2 mmol/L (ref 0.5–1.9)
Lactic Acid, Venous: 2.2 mmol/L (ref 0.5–1.9)
Lactic Acid, Venous: 2.5 mmol/L (ref 0.5–1.9)

## 2022-01-07 LAB — CBG MONITORING, ED
Glucose-Capillary: 161 mg/dL — ABNORMAL HIGH (ref 70–99)
Glucose-Capillary: 164 mg/dL — ABNORMAL HIGH (ref 70–99)

## 2022-01-07 LAB — APTT: aPTT: 27 seconds (ref 24–36)

## 2022-01-07 LAB — GLUCOSE, CAPILLARY
Glucose-Capillary: 132 mg/dL — ABNORMAL HIGH (ref 70–99)
Glucose-Capillary: 128 mg/dL — ABNORMAL HIGH (ref 70–99)

## 2022-01-07 LAB — RESP PANEL BY RT-PCR (FLU A&B, COVID) ARPGX2
Influenza A by PCR: NEGATIVE
Influenza B by PCR: NEGATIVE
SARS Coronavirus 2 by RT PCR: NEGATIVE

## 2022-01-07 MED ORDER — ACETAMINOPHEN 650 MG RE SUPP: 650.0000 mg | Freq: Four times a day (QID) | RECTAL | Status: DC | PRN

## 2022-01-07 MED ORDER — ASPIRIN EC 81 MG PO TBEC
81.0000 mg | DELAYED_RELEASE_TABLET | Freq: Every day | ORAL | Status: DC
Start: 2022-01-07 — End: 2022-01-08

## 2022-01-07 MED ORDER — DICLOFENAC SODIUM 1 % EX GEL
2.0000 g | Freq: Four times a day (QID) | CUTANEOUS | Status: DC
  Administered 2022-01-07 (×4): 2 g via TOPICAL
  Filled 2022-01-07: qty 100

## 2022-01-07 MED ORDER — ONDANSETRON HCL 4 MG/2ML IJ SOLN
4.0000 mg | Freq: Four times a day (QID) | INTRAMUSCULAR | Status: DC | PRN
Start: 2022-01-07 — End: 2022-01-09

## 2022-01-07 MED ORDER — SODIUM CHLORIDE 0.9 % IV BOLUS
500.0000 mL | Freq: Once | INTRAVENOUS | Status: AC
  Administered 2022-01-07: 500 mL via INTRAVENOUS

## 2022-01-07 MED ORDER — DAPAGLIFLOZIN PROPANEDIOL 10 MG PO TABS
10.0000 mg | ORAL_TABLET | Freq: Every day | ORAL | Status: DC
  Filled 2022-01-07 (×2): qty 1

## 2022-01-07 MED ORDER — SODIUM CHLORIDE 0.9 % IV SOLN
500.0000 mg | INTRAVENOUS | Status: DC
  Administered 2022-01-07: 500 mg via INTRAVENOUS
  Filled 2022-01-07: qty 5

## 2022-01-07 MED ORDER — TRAZODONE HCL 50 MG PO TABS: 25.0000 mg | ORAL_TABLET | Freq: Every evening | ORAL | Status: DC | PRN

## 2022-01-07 MED ORDER — HYDROCOD POLI-CHLORPHE POLI ER 10-8 MG/5ML PO SUER: 5.0000 mL | Freq: Two times a day (BID) | ORAL | Status: DC | PRN

## 2022-01-07 MED ORDER — ATORVASTATIN CALCIUM 80 MG PO TABS
80.0000 mg | ORAL_TABLET | Freq: Every day | ORAL | Status: DC
Start: 2022-01-07 — End: 2022-01-08

## 2022-01-07 MED ORDER — LACTATED RINGERS IV BOLUS (SEPSIS)
1000.0000 mL | Freq: Once | INTRAVENOUS | Status: AC
  Administered 2022-01-07: 1000 mL via INTRAVENOUS

## 2022-01-07 MED ORDER — METOPROLOL SUCCINATE ER 25 MG PO TB24
50.0000 mg | ORAL_TABLET | Freq: Every day | ORAL | Status: DC
Start: 2022-01-07 — End: 2022-01-07

## 2022-01-07 MED ORDER — LACTATED RINGERS IV BOLUS (SEPSIS): 1000.0000 mL | Freq: Once | INTRAVENOUS | Status: DC

## 2022-01-07 MED ORDER — SACUBITRIL-VALSARTAN 24-26 MG PO TABS: 1.0000 | ORAL_TABLET | Freq: Two times a day (BID) | ORAL | Status: DC

## 2022-01-07 MED ORDER — INSULIN ASPART 100 UNIT/ML IJ SOLN
0.0000 [IU] | Freq: Three times a day (TID) | INTRAMUSCULAR | Status: DC
  Administered 2022-01-07: 1 [IU] via SUBCUTANEOUS
  Administered 2022-01-07: 2 [IU] via SUBCUTANEOUS
  Administered 2022-01-07: 1 [IU] via SUBCUTANEOUS
  Administered 2022-01-07: 2 [IU] via SUBCUTANEOUS

## 2022-01-07 MED ORDER — ADULT MULTIVITAMIN W/MINERALS CH: 1.0000 | ORAL_TABLET | Freq: Every day | ORAL | Status: DC

## 2022-01-07 MED ORDER — SODIUM CHLORIDE 0.9 % IV SOLN
2.0000 g | INTRAVENOUS | Status: DC
  Administered 2022-01-07: 2 g via INTRAVENOUS
  Filled 2022-01-07: qty 20

## 2022-01-07 MED ORDER — LACTATED RINGERS IV SOLN: INTRAVENOUS | Status: AC

## 2022-01-07 MED ORDER — ONDANSETRON HCL 4 MG PO TABS: 4.0000 mg | ORAL_TABLET | Freq: Four times a day (QID) | ORAL | Status: DC | PRN

## 2022-01-07 MED ORDER — FUROSEMIDE 10 MG/ML IJ SOLN
20.0000 mg | Freq: Two times a day (BID) | INTRAMUSCULAR | Status: DC
  Administered 2022-01-07: 20 mg via INTRAVENOUS
  Filled 2022-01-07: qty 2

## 2022-01-07 MED ORDER — PANTOPRAZOLE SODIUM 40 MG PO TBEC: 40.0000 mg | DELAYED_RELEASE_TABLET | Freq: Every day | ORAL | Status: DC

## 2022-01-07 MED ORDER — ACETAMINOPHEN 325 MG PO TABS: 650.0000 mg | ORAL_TABLET | Freq: Four times a day (QID) | ORAL | Status: DC | PRN

## 2022-01-07 MED ORDER — ENOXAPARIN SODIUM 40 MG/0.4ML IJ SOSY
40.0000 mg | PREFILLED_SYRINGE | INTRAMUSCULAR | Status: DC
  Administered 2022-01-07: 40 mg via SUBCUTANEOUS
  Filled 2022-01-07: qty 0.4

## 2022-01-07 MED ORDER — LACTATED RINGERS IV BOLUS (SEPSIS): 500.0000 mL | Freq: Once | INTRAVENOUS | Status: DC

## 2022-01-07 MED ORDER — FUROSEMIDE 10 MG/ML IJ SOLN
40.0000 mg | Freq: Once | INTRAMUSCULAR | Status: AC
  Administered 2022-01-07: 40 mg via INTRAVENOUS
  Filled 2022-01-07: qty 4

## 2022-01-07 MED ORDER — SODIUM CHLORIDE 0.9 % IV SOLN
500.0000 mg | INTRAVENOUS | Status: DC
  Administered 2022-01-07: 500 mg via INTRAVENOUS
  Filled 2022-01-07 (×2): qty 5

## 2022-01-07 MED ORDER — LORAZEPAM 2 MG/ML IJ SOLN: 0.5000 mg | INTRAMUSCULAR | Status: DC | PRN

## 2022-01-07 MED ORDER — EZETIMIBE 10 MG PO TABS
10.0000 mg | ORAL_TABLET | Freq: Every day | ORAL | Status: DC
Start: 2022-01-07 — End: 2022-01-08

## 2022-01-07 MED ORDER — POTASSIUM CHLORIDE CRYS ER 10 MEQ PO TBCR: 10.0000 meq | EXTENDED_RELEASE_TABLET | Freq: Every day | ORAL | Status: DC

## 2022-01-07 MED ORDER — VANCOMYCIN HCL 1500 MG/300ML IV SOLN
1500.0000 mg | Freq: Once | INTRAVENOUS | Status: AC
  Administered 2022-01-07: 1500 mg via INTRAVENOUS
  Filled 2022-01-07 (×2): qty 300

## 2022-01-07 MED ORDER — MAGNESIUM HYDROXIDE 400 MG/5ML PO SUSP: 30.0000 mL | Freq: Every day | ORAL | Status: DC | PRN

## 2022-01-07 MED ORDER — IPRATROPIUM-ALBUTEROL 0.5-2.5 (3) MG/3ML IN SOLN
3.0000 mL | Freq: Four times a day (QID) | RESPIRATORY_TRACT | Status: DC
  Administered 2022-01-07 – 2022-01-08 (×4): 3 mL via RESPIRATORY_TRACT
  Filled 2022-01-07 (×4): qty 3

## 2022-01-07 MED ORDER — VANCOMYCIN VARIABLE DOSE PER UNSTABLE RENAL FUNCTION (PHARMACIST DOSING)
Status: DC
Start: 2022-01-07 — End: 2022-01-08

## 2022-01-07 MED ORDER — METFORMIN HCL ER 750 MG PO TB24
750.0000 mg | ORAL_TABLET | Freq: Every day | ORAL | Status: DC
Start: 2022-01-07 — End: 2022-01-07
  Filled 2022-01-07: qty 1

## 2022-01-07 MED ORDER — NITROGLYCERIN 0.4 MG SL SUBL: 0.4000 mg | SUBLINGUAL_TABLET | SUBLINGUAL | Status: DC | PRN

## 2022-01-07 MED ORDER — LORAZEPAM 2 MG/ML IJ SOLN
0.5000 mg | INTRAMUSCULAR | Status: DC | PRN
  Administered 2022-01-07 (×2): 0.5 mg via INTRAVENOUS
  Filled 2022-01-07 (×2): qty 1

## 2022-01-07 MED ORDER — GUAIFENESIN ER 600 MG PO TB12: 600.0000 mg | ORAL_TABLET | Freq: Two times a day (BID) | ORAL | Status: DC

## 2022-01-07 NOTE — Progress Notes (Addendum)
Updated patient's wife again at the bedside patient continues to deteriorate not do well.  Continue current management.  She has agreed to adding Ativan as needed.  Her daughter is coming into visit.  She understands he may not make it through the night.  No changes at this time.   Patient seen and examined updated wife at bedside.  She wishes to make patient DNR.  She wants no intubation and no CPR but wants all other medical treatment.   Check troponin level.  Have called cardiology to make sure that there seeing patient from the consult made last night.  Stopping beta-blocker due to soft blood pressures in order to possibly diurese.  Does not appear to be tolerating diuresis by boluses.  May need Lasix drip.  CODE STATUS changed to DNR.  Being treated for septic shock, rales on exam currently on BiPAP.

## 2022-01-07 NOTE — Sepsis Progress Note (Signed)
Notified provider of need to order repeat lactic acid. ° °

## 2022-01-07 NOTE — Progress Notes (Addendum)
Heart Failure Nurse Navigator Progress Note  Patient screened for HF [facility] Impact clinic appropriateness. It seems at this time he care may take a palliative approach and prognosis is poor.  HF Nav  will follow during hospitalization.

## 2022-01-07 NOTE — Progress Notes (Signed)
Pt taken off BiPAP and placed in 14L 55% venturi mask by RT. Pt tolerating well at this time RN aware, RT will continue to monitor.

## 2022-01-07 NOTE — Progress Notes (Signed)
Paged Dr. Shanon Brow regarding patient's status and current vital signs, SBP in low 80s, sats dipping to upper 80s on venturi (RT on the way to assess if biPAP needs to be back on), and resps continue to be in the 30-40s. She states shes aware and that she has discussed prognosis with patients wife and he is a dnr. She states she will come by shortly to evaluate. No new orders given.

## 2022-01-07 NOTE — ED Provider Notes (Incomplete)
[place] EMERGENCY DEPARTMENT Provider Note   CSN: [id number] Arrival date & time: 12/30/2021  0002     History {Add pertinent medical, surgical, social history, OB history to HPI:1} Chief Complaint  Patient presents with   Weakness    Bob Johnson is a 71 y.o. male.  71 yo M with a chief complaint of fatigue and confusion.  Is been going on for about 3 to 4 days.  Was recently in the hospital for a COPD exacerbation.  He feels like he has been eating and drinking okay.  Tells me that he is not on oxygen at home.  Was placed on 4 L by EMS.  Patient is somewhat confused and has difficulty providing much history but denies any chest discomfort denies any abdominal pain denies nausea or vomiting.  He feels like he has been eating and drinking normally.   Weakness     Home Medications Prior to Admission medications   Medication Sig Start Date End Date Taking? Authorizing Provider  aspirin EC 81 MG tablet Take 1 tablet (81 mg total) by mouth daily. Swallow whole. 01/08/22 01/08/23  Mercy Riding, MD  atorvastatin (LIPITOR) 80 MG tablet Take 80 mg by mouth daily. 11/19/21   [provider]  dapagliflozin propanediol (FARXIGA) 10 MG TABS tablet Take 1 tablet (10 mg total) by mouth daily. 01/01/22   Mercy Riding, MD  diclofenac Sodium (VOLTAREN) 1 % GEL Apply 2 g topically 4 (four) times daily. 01/01/22   Mercy Riding, MD  ENTRESTO 24-26 MG 1 tablet 2 (two) times daily. 12/20/21   [provider]  ezetimibe (ZETIA) 10 MG tablet Take 10 mg by mouth daily. 11/19/21   [provider]  furosemide (LASIX) 40 MG tablet Take 80 mg by mouth daily. 11/19/21   [provider]  metFORMIN (GLUCOPHAGE-XR) 750 MG 24 hr tablet Take 1 tablet (750 mg total) by mouth daily with breakfast. 01/01/22   Mercy Riding, MD  metoprolol succinate (TOPROL-XL) 50 MG 24 hr tablet Take 1 tablet (50 mg total) by mouth daily. 01/01/22 01/01/23  Mercy Riding, MD  Multiple  Vitamin (MULTIVITAMIN) capsule Take 1 capsule by mouth daily.    [provider]  nitroGLYCERIN (NITROSTAT) 0.4 MG SL tablet Place 0.4 mg under the tongue every 5 (five) minutes as needed for chest pain. 04/19/21   [provider]  pantoprazole (PROTONIX) 40 MG tablet Take 1 tablet (40 mg total) by mouth daily. 01/01/22   Mercy Riding, MD  potassium chloride (KLOR-CON M) 10 MEQ tablet Take 10 mEq by mouth daily. 02/23/21 02/23/22  [provider]  spironolactone (ALDACTONE) 25 MG tablet Take 0.5 tablets (12.5 mg total) by mouth daily. 01/01/22   Mercy Riding, MD      Allergies    Patient has no known allergies.    Review of Systems   Review of Systems  Neurological:  Positive for weakness.   Physical Exam Updated Vital Signs BP (!) 85/40    Pulse 95    Resp (!) 22    Ht 6' (1.829 m)    Wt 75 kg    SpO2 94%    BMI 22.42 kg/m  Physical Exam Vitals and nursing note reviewed.  Constitutional:      Appearance: He is well-developed.  HENT:     Head: Normocephalic and atraumatic.  Eyes:     Pupils: Pupils are equal, round, and reactive to light.  Neck:  Vascular: No JVD.  Cardiovascular:     Rate and Rhythm: Normal rate and regular rhythm.     Heart sounds: No murmur heard.   No friction rub. No gallop.  Pulmonary:     Effort: No respiratory distress.     Breath sounds: No wheezing.  Abdominal:     General: There is no distension.     Tenderness: There is no abdominal tenderness. There is no guarding or rebound.  Musculoskeletal:        General: Normal range of motion.     Cervical back: Normal range of motion and neck supple.  Skin:    Coloration: Skin is not pale.     Findings: No rash.  Neurological:     Mental Status: He is alert and oriented to person, place, and time.  Psychiatric:        Behavior: Behavior normal.    ED Results / Procedures / Treatments   Labs (all labs ordered are listed, but only abnormal results are displayed) Labs  Reviewed  CULTURE, BLOOD (ROUTINE X 2)  CULTURE, BLOOD (ROUTINE X 2)  RESP PANEL BY RT-PCR (FLU A&B, COVID) ARPGX2  COMPREHENSIVE METABOLIC PANEL  LACTIC ACID, PLASMA  LACTIC ACID, PLASMA  CBC WITH DIFFERENTIAL/PLATELET  PROTIME-INR  URINALYSIS, ROUTINE W REFLEX MICROSCOPIC  APTT    EKG None  Radiology No results found.  Procedures Procedures  {Document cardiac monitor, telemetry assessment procedure when appropriate:1}  Medications Ordered in ED Medications  lactated ringers infusion (has no administration in time range)  cefTRIAXone (ROCEPHIN) 2 g in sodium chloride 0.9 % 100 mL IVPB (has no administration in time range)  azithromycin (ZITHROMAX) 500 mg in sodium chloride 0.9 % 250 mL IVPB (has no administration in time range)  lactated ringers bolus 1,000 mL (has no administration in time range)    And  lactated ringers bolus 1,000 mL (has no administration in time range)    And  lactated ringers bolus 500 mL (has no administration in time range)    ED Course/ Medical Decision Making/ A&P                           Medical Decision Making Amount and/or Complexity of Data Reviewed Labs: ordered. Radiology: ordered. ECG/medicine tests: ordered.  Risk Prescription drug management.   ***  {Document critical care time when appropriate:1} {Document review of labs and clinical decision tools ie heart score, Chads2Vasc2 etc:1}  {Document your independent review of radiology images, and any outside records:1} {Document your discussion with family members, caretakers, and with consultants:1} {Document social determinants of health affecting pt's care:1} {Document your decision making why or why not admission, treatments were needed:1} Final Clinical Impression(s) / ED Diagnoses Final diagnoses:  None    Rx / DC Orders ED Discharge Orders     None

## 2022-01-07 NOTE — ED Provider Notes (Signed)
[place] EMERGENCY DEPARTMENT Provider Note   CSN: [id number] Arrival date & time: 01/08/2022  0002     History  Chief Complaint  Patient presents with   Weakness    Bob Johnson is a 71 y.o. male.  71 yo M with a chief complaint of fatigue and confusion.  Is been going on for about 3 to 4 days.  Was recently in the hospital for a COPD exacerbation.  He feels like he has been eating and drinking okay.  Tells me that he is not on oxygen at home.  Was placed on 4 L by EMS.  Patient is somewhat confused and has difficulty providing much history but denies any chest discomfort denies any abdominal pain denies nausea or vomiting.  He feels like he has been eating and drinking normally.   Weakness     Home Medications Prior to Admission medications   Medication Sig Start Date End Date Taking? Authorizing Provider  aspirin EC 81 MG tablet Take 1 tablet (81 mg total) by mouth daily. Swallow whole. 01/08/22 01/08/23  Mercy Riding, MD  atorvastatin (LIPITOR) 80 MG tablet Take 80 mg by mouth daily. 11/19/21   [provider]  dapagliflozin propanediol (FARXIGA) 10 MG TABS tablet Take 1 tablet (10 mg total) by mouth daily. 01/01/22   Mercy Riding, MD  diclofenac Sodium (VOLTAREN) 1 % GEL Apply 2 g topically 4 (four) times daily. 01/01/22   Mercy Riding, MD  ENTRESTO 24-26 MG 1 tablet 2 (two) times daily. 12/20/21   [provider]  ezetimibe (ZETIA) 10 MG tablet Take 10 mg by mouth daily. 11/19/21   [provider]  furosemide (LASIX) 40 MG tablet Take 80 mg by mouth daily. 11/19/21   [provider]  metFORMIN (GLUCOPHAGE-XR) 750 MG 24 hr tablet Take 1 tablet (750 mg total) by mouth daily with breakfast. 01/01/22   Mercy Riding, MD  metoprolol succinate (TOPROL-XL) 50 MG 24 hr tablet Take 1 tablet (50 mg total) by mouth daily. 01/01/22 01/01/23  Mercy Riding, MD  Multiple Vitamin (MULTIVITAMIN) capsule Take 1 capsule by mouth daily.    [provider]  nitroGLYCERIN (NITROSTAT) 0.4 MG SL tablet Place 0.4 mg under the tongue every 5 (five) minutes as needed for chest pain. 04/19/21   [provider]  pantoprazole (PROTONIX) 40 MG tablet Take 1 tablet (40 mg total) by mouth daily. 01/01/22   Mercy Riding, MD  potassium chloride (KLOR-CON M) 10 MEQ tablet Take 10 mEq by mouth daily. 02/23/21 02/23/22  [provider]  spironolactone (ALDACTONE) 25 MG tablet Take 0.5 tablets (12.5 mg total) by mouth daily. 01/01/22   Mercy Riding, MD      Allergies    Patient has no known allergies.    Review of Systems   Review of Systems  Neurological:  Positive for weakness.   Physical Exam Updated Vital Signs BP 99/66    Pulse (!) 113    Temp 98.7 F (37.1 C)    Resp (!) 28    Ht 6' (1.829 m)    Wt 75 kg    SpO2 97%    BMI 22.42 kg/m  Physical Exam Vitals and nursing note reviewed.  Constitutional:      Appearance: He is well-developed.  HENT:     Head: Normocephalic and atraumatic.  Eyes:     Pupils: Pupils are equal, round, and reactive to light.  Neck:  Vascular: No JVD.  Cardiovascular:     Rate and Rhythm: Normal rate and regular rhythm.     Heart sounds: No murmur heard.   No friction rub. No gallop.  Pulmonary:     Effort: No respiratory distress.     Breath sounds: No wheezing.  Abdominal:     General: There is no distension.     Tenderness: There is no abdominal tenderness. There is no guarding or rebound.  Musculoskeletal:        General: Normal range of motion.     Cervical back: Normal range of motion and neck supple.  Skin:    Coloration: Skin is not pale.     Findings: No rash.  Neurological:     Mental Status: He is alert and oriented to person, place, and time.  Psychiatric:        Behavior: Behavior normal.    ED Results / Procedures / Treatments   Labs (all labs ordered are listed, but only abnormal results are displayed) Labs Reviewed  COMPREHENSIVE METABOLIC PANEL -  Abnormal; Notable for the following components:      Result Value   CO2 17 (*)    Glucose, Bld 167 (*)    BUN 36 (*)    Creatinine, Ser 2.21 (*)    Calcium 8.5 (*)    Albumin 2.2 (*)    GFR, Estimated 31 (*)    All other components within normal limits  LACTIC ACID, PLASMA - Abnormal; Notable for the following components:   Lactic Acid, Venous 2.2 (*)    All other components within normal limits  CBC WITH DIFFERENTIAL/PLATELET - Abnormal; Notable for the following components:   WBC 28.8 (*)    RBC 3.55 (*)    Hemoglobin 9.8 (*)    HCT 31.0 (*)    Platelets 130 (*)    Neutro Abs 26.5 (*)    Lymphs Abs 0.3 (*)    Monocytes Absolute 1.6 (*)    Abs Immature Granulocytes 0.29 (*)    All other components within normal limits  PROTIME-INR - Abnormal; Notable for the following components:   Prothrombin Time 17.4 (*)    INR 1.4 (*)    All other components within normal limits  RESP PANEL BY RT-PCR (FLU A&B, COVID) ARPGX2  CULTURE, BLOOD (ROUTINE X 2)  CULTURE, BLOOD (ROUTINE X 2)  APTT  LACTIC ACID, PLASMA  URINALYSIS, ROUTINE W REFLEX MICROSCOPIC    EKG EKG Interpretation  Date/Time:  Friday January 07 2022 00:37:18 EST Ventricular Rate:  92 PR Interval:  151 QRS Duration: 144 QT Interval:  368 QTC Calculation: 456 R Axis:   87 Text Interpretation: Sinus rhythm Atrial premature complexes Right bundle branch block Consider inferior infarct Anterior infarct, age indeterminate No significant change since last tracing Confirmed by Deno Etienne [phone]) on 01/06/2022 12:50:04 AM  Radiology DG Chest 2 View  Result Date: 01/06/2022 CLINICAL DATA:  Suspected sepsis. EXAM: CHEST - 2 VIEW COMPARISON:  Radiograph 12/25/2021, CT 12/23/2021 FINDINGS: Cardiomegaly. Moderate interstitial thickening likely pulmonary edema, similar. Persistent but improved bilateral pleural effusions. Trace fluid in the fissures. No pneumothorax. Emphysematous changes better appreciated on prior CT.  IMPRESSION: 1. Cardiomegaly with pulmonary edema. 2. Improved but persistent bilateral pleural effusions. Electronically Signed   By: Keith Rake M.D.   On: 12/27/2021 01:20    Procedures Procedures    Medications Ordered in ED Medications  lactated ringers infusion (0 mLs Intravenous Hold 12/15/2021 0046)  cefTRIAXone (ROCEPHIN) 2 g in  sodium chloride 0.9 % 100 mL IVPB (0 g Intravenous Stopped 12/26/2021 0127)  azithromycin (ZITHROMAX) 500 mg in sodium chloride 0.9 % 250 mL IVPB (0 mg Intravenous Stopped 12/26/2021 0232)  lactated ringers bolus 1,000 mL (0 mLs Intravenous Stopped 12/22/2021 0147)    And  lactated ringers bolus 1,000 mL (0 mLs Intravenous Hold 12/23/2021 0045)    And  lactated ringers bolus 500 mL (0 mLs Intravenous Hold 12/17/2021 0045)  furosemide (LASIX) injection 40 mg (40 mg Intravenous Given 12/23/2021 0257)    ED Course/ Medical Decision Making/ A&P                           Medical Decision Making Amount and/or Complexity of Data Reviewed Labs: ordered. Radiology: ordered. ECG/medicine tests: ordered.  Risk Prescription drug management. Decision regarding hospitalization.   Patient is a 71 y.o. male with a cc of fatigue.  Patient was just in the hospital with acute anemia and a CHF exacerbation.  The patient felt he had been eating and drinking normally but further history was obtained from his wife has not really been eating and drinking well for the past 3 to 4 days and had a persistent cough.  On my exam the patient is clinically dehydrated his blood pressure with EMS was in the 70s and now requiring oxygen and not typically on oxygen.  He was given a liter of IV fluids with significant improvement of his blood pressure but persistent tachypnea.  On repeat lung exam and now hearing Rales up to the mid lung.  We will give a bolus dose of Lasix.  Chest x-ray independently interpreted by me with some edema versus multifocal pneumonia.  Seems unchanged from prior.  Patient  with an AKI consistent with his dehydration.Marland Kitchen  CRITICAL CARE Performed by: Cecilio Asper   Total critical care time: 35 minutes  Critical care time was exclusive of separately billable procedures and treating other patients.  Critical care was necessary to treat or prevent imminent or life-threatening deterioration.  Critical care was time spent personally by me on the following activities: development of treatment plan with patient and/or surrogate as well as nursing, discussions with consultants, evaluation of patient's response to treatment, examination of patient, obtaining history from patient or surrogate, ordering and performing treatments and interventions, ordering and review of laboratory studies, ordering and review of radiographic studies, pulse oximetry and re-evaluation of patient's condition.         Final Clinical Impression(s) / ED Diagnoses Final diagnoses:  Dehydration  Hypotension due to hypovolemia    Rx / DC Orders ED Discharge Orders     None         Deno Etienne, DO 12/22/2021 2122

## 2022-01-07 NOTE — ED Notes (Signed)
Pt brief and linens were changed, pt had loose stool and open pressure sores around his sacrum. There is reddened skin breakdown between his legs and around his groin. Barrier cream was applied as well as a sacral dressing. Pt was moved up in bed and readjusted.

## 2022-01-07 NOTE — ED Triage Notes (Signed)
Pt BIB [facility] for eval of weakness and confusion. EMS reports pt was recently dcd from here after COPD exacerbation about 5 days ago. EMS reports pt has been gradually declining over the last 4 days, and has been unable to ambulate and care for himself at home. EMS reports recent dx of stomach CA. Initially satting 85% on RA, improved to 95% on 4LPM

## 2022-01-07 NOTE — Progress Notes (Addendum)
HOSPITAL MEDICINE OVERNIGHT EVENT NOTE    Notified by nursing the patient's lactic acid is currently 2.0, up slightly from previous value of 1.9.  Chart reviewed, clinically clinical course has been tenuous.  Patient is suffering from sepsis with concurrent acute hypoxic respiratory failure and has received a course of isotonic fluids earlier in the hospitalization.    However patient also has severe congestive heart failure with an ejection fraction of 20 to 25% and is now receiving scheduled Lasix therapy.  Considering patient's significant respiratory failure with bouts of hypoxia, this alone could be the cause of the patient's mild lactic acidosis.    We will therefore not provide the patient with any additional fluid, continue current plan of care and obtain repeat lactic acid level in the morning.  Vernelle Emerald  MD [facility]   OVERNIGHT EVENT (2/25 2:25am)  Notified by nursing that patient has had minimal urine output this evening.  Bladder scan performed revealing 960 cc in the bladder.  We will proceed with In-N-Out urinary catheterization and obtain urinalysis.  Sherryll Burger Khali Perella

## 2022-01-07 NOTE — Progress Notes (Signed)
Pharmacy Antibiotic Note  Bob Johnson is a 71 y.o. male admitted on 01/08/2022 with pneumonia.  Pharmacy has been consulted for vancomycin dosing.  Patient with a history of CAD, T2DM, HF, HTN, dyslipidemia, neuropathy and peripheral vascular disease. Patient presenting with  fatigue and confusion.  SCr 2.04 - above baseline WBC 28.5; LA 2.5; T 98.7 F; Flu/COVID neg  Plan: Azithromycin per MD Ceftriaxone per MD Vancomycin 1500 mg once, subsequent dosing as indicated per random vancomycin level until renal function stable and/or improved, at which time scheduled dosing can be considered Trend WBC, Fever, Renal function, & Clinical course F/u cultures, clinical course, WBC, fever De-escalate when able F/u MRSA PCR  Height: 6' (182.9 cm) Weight: 75 kg (165 lb 5.5 oz) IBW/kg (Calculated) : 77.6  Temp (24hrs), Avg:98.7 F (37.1 C), Min:98.7 F (37.1 C), Max:98.7 F (37.1 C)  Recent Labs  Lab 01/01/22 0207 12/18/2021 0010 12/20/2021 0332 12/20/2021 0408 01/11/2022 0838  WBC 13.4* 28.8*  --  28.5*  --   CREATININE 1.41* 2.21*  --  2.04*  --   LATICACIDVEN  --  2.2* 2.5*  --  1.9    Estimated Creatinine Clearance: 35.2 mL/min (A) (by C-G formula based on SCr of 2.04 mg/dL (H)).    No Known Allergies  Antimicrobials this admission: vancomycin 2/24 >>  azithromycin 2/24 >>  ceftriaxone 2/24 >>   Microbiology results: Pending  Thank you for allowing pharmacy to be a part of this patients care.  Lorelei Pont, PharmD, BCPS 01/06/2022 3:19 PM ED Clinical Pharmacist -  [phone]

## 2022-01-07 NOTE — H&P (Addendum)
[place]   PATIENT NAME: Bob Johnson    MR#:  [id number]  DATE OF BIRTH:  11/11/1951  DATE OF ADMISSION:  12/22/2021  PRIMARY CARE PHYSICIAN: Fransisca Connors, FNP   Patient is coming from: Home  REQUESTING/REFERRING PHYSICIAN: Deno Etienne, DO  CHIEF COMPLAINT:   Chief Complaint  Patient presents with   Weakness  Shortness of breath  HISTORY OF PRESENT ILLNESS:  Bob Johnson is a 71 y.o. Caucasian male with medical history significant for combined systolic and diastolic CHF, coronary artery disease, type 2 diabetes mellitus, hypertension, dyslipidemia, ischemic, neuropathy and peripheral vascular disease, who presented to the ER with acute onset of worsening weakness and dyspnea especially on exertion and hypotension in the 70s with new hypoxia requiring 4 L of O2 by nasal cannula.  The patient was recently admitted here for acute on chronic systolic CHF and acute respiratory failure with hypoxia.  He has been doing well since his discharge and has been ambulating with PT at home.  This morning at around 3 AM he got short of breath and was unable to ambulate per his wife.  He admitted to cough and wheezing.  She stated that he had nausea and vomiting once with mucus.  No fever or chills.  No diarrhea or melena or bright red blood per rectum.  No dysuria, oliguria or hematuria or flank pain.  His wife was giving most of the history as the patient was mildly confused.  The patient was respiratory distress in the ER and therefore placed on BiPAP  ED Course: When he came to the ER, BP was 85/40 with a heart rate of 95 and respiratory to 22 and later heart rate was 104 and BP was 99/66 than 90/73.  Labs revealed a BUN of 36 and creatinine 2.21 compared to 19/1.41 on 2/18 and blood glucose was 167 with CO2 of 17 compared to 19 done.  Lactic acid was 2.2 and CBC showed leukocytosis 28.8 with neutrophilia and anemia with hemoglobin 9.8 hematocrit 31 comparable to previous levels and  platelets of 130 compared to 154.  INR is 1.4 and PT 17.4 with PTT 27.  Influenza antigens and COVID-19 PCR came back negative.  Blood cultures were drawn.  Lactic acid level was 2.5 EKG as reviewed by me : EKG showed normal sinus rhythm with a rate of 92 with PACs, right bundle branch block with Q waves inferiorly. Imaging: Chest ray showed cardiomegaly with pulmonary edema and improved but persistent bilateral pleural effusions.  I cannot rule out underlying pneumonia.  The patient was given 40 mg of IV Lasix, 2 L bolus of IV lactated Ringer and 500 mL additional bolus as well as IV  Rocephin and Zithromax.  He will be admitted to a progressive unit bed for further evaluation and management.   PAST MEDICAL HISTORY:   Past Medical History:  Diagnosis Date   Congestive heart failure (CHF) ([place])    Coronary artery disease    Diabetes mellitus ([place])    Hyperlipemia    Hypertension    Ischemic cardiomyopathy    MI (myocardial infarction) ([place])    Peripheral vascular disease ([place])     PAST SURGICAL HISTORY:   Past Surgical History:  Procedure Laterality Date   BIOPSY  12/31/2021   Procedure: BIOPSY;  Surgeon: [name], MD;  Location: [place];  Service: Gastroenterology;;   COLONOSCOPY N/A 12/31/2021   Procedure: COLONOSCOPY;  Surgeon: [name], MD;  Location: [facility] ENDOSCOPY;  Service: Gastroenterology;  Laterality: N/A;   ESOPHAGOGASTRODUODENOSCOPY N/A 12/31/2021   Procedure: ESOPHAGOGASTRODUODENOSCOPY (EGD);  Surgeon: [name], MD;  Location: [facility] ENDOSCOPY;  Service: Gastroenterology;  Laterality: N/A;   No prior surgery     POLYPECTOMY  12/31/2021   Procedure: POLYPECTOMY;  Surgeon: [name], MD;  Location: [facility] ENDOSCOPY;  Service: Gastroenterology;;    SOCIAL HISTORY:   Social History   Tobacco Use   Smoking status: Former    Types: Cigarettes   Smokeless tobacco: Never  Substance Use Topics   Alcohol use: Never    FAMILY  HISTORY:   Family History  Problem Relation Age of Onset   Cancer Mother    Heart attack Father    Alcohol abuse Father     DRUG ALLERGIES:  No Known Allergies  REVIEW OF SYSTEMS:   ROS As per history of present illness. All pertinent systems were reviewed above. Constitutional, HEENT, cardiovascular, respiratory, GI, GU, musculoskeletal, neuro, psychiatric, endocrine, integumentary and hematologic systems were reviewed and are otherwise negative/unremarkable except for positive findings mentioned above in the HPI.   MEDICATIONS AT HOME:   Prior to Admission medications   Medication Sig Start Date End Date Taking? Authorizing Provider  aspirin EC 81 MG tablet Take 1 tablet (81 mg total) by mouth daily. Swallow whole. 01/08/22 01/08/23  Mercy Riding, MD  atorvastatin (LIPITOR) 80 MG tablet Take 80 mg by mouth daily. 11/19/21   [provider]  dapagliflozin propanediol (FARXIGA) 10 MG TABS tablet Take 1 tablet (10 mg total) by mouth daily. 01/01/22   Mercy Riding, MD  diclofenac Sodium (VOLTAREN) 1 % GEL Apply 2 g topically 4 (four) times daily. 01/01/22   Mercy Riding, MD  ENTRESTO 24-26 MG 1 tablet 2 (two) times daily. 12/20/21   [provider]  ezetimibe (ZETIA) 10 MG tablet Take 10 mg by mouth daily. 11/19/21   [provider]  furosemide (LASIX) 40 MG tablet Take 80 mg by mouth daily. 11/19/21   [provider]  metFORMIN (GLUCOPHAGE-XR) 750 MG 24 hr tablet Take 1 tablet (750 mg total) by mouth daily with breakfast. 01/01/22   Mercy Riding, MD  metoprolol succinate (TOPROL-XL) 50 MG 24 hr tablet Take 1 tablet (50 mg total) by mouth daily. 01/01/22 01/01/23  Mercy Riding, MD  Multiple Vitamin (MULTIVITAMIN) capsule Take 1 capsule by mouth daily.    [provider]  nitroGLYCERIN (NITROSTAT) 0.4 MG SL tablet Place 0.4 mg under the tongue every 5 (five) minutes as needed for chest pain. 04/19/21   [provider]  pantoprazole (PROTONIX)  40 MG tablet Take 1 tablet (40 mg total) by mouth daily. 01/01/22   Mercy Riding, MD  potassium chloride (KLOR-CON M) 10 MEQ tablet Take 10 mEq by mouth daily. 02/23/21 02/23/22  [provider]  spironolactone (ALDACTONE) 25 MG tablet Take 0.5 tablets (12.5 mg total) by mouth daily. 01/01/22   Mercy Riding, MD      VITAL SIGNS:  Blood pressure 99/66, pulse (!) 111, temperature 98.7 F (37.1 C), resp. rate (!) 28, height 6' (1.829 m), weight 75 kg, SpO2 97 %.  PHYSICAL EXAMINATION:  Physical Exam  GENERAL: Acutely ill, restless 71 y.o.-year-old male patient lying in the bed with moderate respiratory distress on BiPAP. EYES: Pupils equal, round, reactive to light and accommodation. No scleral icterus. Extraocular muscles intact.  HEENT: Head atraumatic, normocephalic. Oropharynx and nasopharynx clear.  NECK:  Supple, no jugular venous distention. No thyroid  enlargement, no tenderness.  LUNGS: Diminished bibasilar breath sounds with bibasal crackles. CARDIOVASCULAR: Regular rate and rhythm, S1, S2 normal. No murmurs, rubs, or gallops.  ABDOMEN: Soft, nondistended, nontender. Bowel sounds present. No organomegaly or mass.  EXTREMITIES: No pedal edema, cyanosis, or clubbing.  NEUROLOGIC: Cranial nerves II through XII are intact. Muscle strength 5/5 in all extremities. Sensation intact. Gait not checked.  PSYCHIATRIC: The patient is alert and oriented x 3.  Normal affect and good eye contact. SKIN: No obvious rash, lesion, or ulcer.   LABORATORY PANEL:   CBC Recent Labs  Lab 12/29/2021 0010  WBC 28.8*  HGB 9.8*  HCT 31.0*  PLT 130*   ------------------------------------------------------------------------------------------------------------------  Chemistries  Recent Labs  Lab 01/01/22 0207 12/27/2021 0010  NA 135 137  K 3.5 3.9  CL 105 106  CO2 19* 17*  GLUCOSE 133* 167*  BUN 19 36*  CREATININE 1.41* 2.21*  CALCIUM 8.5* 8.5*  MG 2.3  --   AST  --  41  ALT  --  35   ALKPHOS  --  82  BILITOT  --  0.5   ------------------------------------------------------------------------------------------------------------------  Cardiac Enzymes No results for input(s): TROPONINI in the last 168 hours. ------------------------------------------------------------------------------------------------------------------  RADIOLOGY:  DG Chest 2 View  Result Date: 12/23/2021 CLINICAL DATA:  Suspected sepsis. EXAM: CHEST - 2 VIEW COMPARISON:  Radiograph 12/25/2021, CT 12/23/2021 FINDINGS: Cardiomegaly. Moderate interstitial thickening likely pulmonary edema, similar. Persistent but improved bilateral pleural effusions. Trace fluid in the fissures. No pneumothorax. Emphysematous changes better appreciated on prior CT. IMPRESSION: 1. Cardiomegaly with pulmonary edema. 2. Improved but persistent bilateral pleural effusions. Electronically Signed   By: Keith Rake M.D.   On: 12/29/2021 01:20      IMPRESSION AND PLAN:  Principal Problem:   Acute respiratory failure ([place])  1.  Acute hypoxic respiratory failure requiring BiPAP likely secondary to acute on chronic combined systolic and diastolic CHF as well as possible underlying Community-acquired pneumonia with bilateral pleural effusions and subsequent sepsis as manifested by tachycardia and tachypnea with significant leukocytosis.  He meets criteria for severe sepsis given lactic acid of 2.5 and hypotension. - The patient will be admitted to a progressive unit bed. - We will continue antibiotic therapy with IV Rocephin and Zithromax. - Mucolytic therapy and bronchodilator therapy will be provided. - We will follow blood and sputum cultures. - We will follow pneumonia antigens. - We will gently diurese with improvement of his blood pressure. - Recent 2D echo revealed an EF of 20 to 75% grade 2 diastolic dysfunction, left atrial severe dilatation and right atrial mild dilatation with moderate 3 elevated pulmonary arterial  systolic pressure and trivial mitral valve regurgitation with moderate tricuspid regurgitation. - We will continue with Delene Loll, Farxiga, Aldactone and Toprol-XL.  2.  Elevated troponin I. - While this could be related  to his acute CHF, will need to rule out ACS. - We will obtain a 2D echo and a cardiology consult. - I notified [name] as well as [name].  3.  Dyslipidemia. - We will continue statin therapy.  4.  Coronary artery disease. - We will continue his statin therapy and Zetia, Toprol-XL, and as needed sublingual nitroglycerin.  5.  GERD. - We will continue PPI therapy.   DVT prophylaxis: Lovenox. Advanced Care Planning:  Code Status: full code. Family Communication:  The plan of care was discussed in details with the patient (and family). I answered all questions. The patient agreed to proceed with the above  mentioned plan. Further management will depend upon hospital course. Disposition Plan: Back to previous home environment Consults called: none.  All the records are reviewed and case discussed with ED provider.  Status is: Inpatient  At the time of the admission, it appears that the appropriate admission status for this patient is inpatient.  This is judged to be reasonable and necessary in order to provide the required intensity of service to ensure the patient's safety given the presenting symptoms, physical exam findings and initial radiographic and laboratory data in the context of comorbid conditions.  The patient requires inpatient status due to high intensity of service, high risk of further deterioration and high frequency of surveillance required.  I certify that at the time of admission, it is my clinical judgment that the patient will require inpatient hospital care extending more than 2 midnights.                            Dispo: The patient is from: Home              Anticipated d/c is to: Home              Patient currently is not  medically stable to d/c.              Difficult to place patient: No  Authorized and performed by: Eugenie Norrie, MD Total critical care time: Approximately   55    minutes. Due to a high probability of clinically significant, life-threatening deterioration, the patient required my highest level of preparedness to intervene emergently and I personally spent this critical care time directly and personally managing the patient.  This critical care time included obtaining a history, examining the patient, pulse oximetry, ordering and review of studies, arranging urgent treatment with development of management plan, evaluation of patient's response to treatment, frequent reassessment, and discussions with other providers. This critical care time was performed to assess and manage the high probability of imminent, life-threatening deterioration that could result in multiorgan failure.  It was exclusive of separately billable procedures and treating other patients and teaching time.    Christel Mormon M.D on 01/02/2022 at 3:51 AM  [facility]   From 7 PM-7 AM, contact night-coverage [email or web address]  CC: Primary care physician; Fransisca Connors, FNP

## 2022-01-07 NOTE — Consult Note (Signed)
Cardiology Consultation:   Patient ID: Bob Johnson MRN: [id number]; DOB: [birth year]  Admit date: 12/29/2021 Date of Consult: 12/26/2021  PCP:  Fransisca Connors, FNP   [facility] Providers Cardiologist:  Kirk Ruths, MD - previously followed by Dr. Flonnie Overman at [facility]     Patient Profile:   Bob Johnson is a 71 y.o. male with a hx of CAD s/p anterior STEMI 2012 treated with BMS to LAD with CTO of mid LCx, ischemic cardiomyopathy, HTN, HLD, PAD, RBBB, history of penetrating ulcer aortic arch and AAA who is being seen 12/27/2021 for the evaluation of possible CHF and elevated troponin at the request of Dr. Shanon Brow.  History of Present Illness:   Bob Johnson is a 71 year old male with past medical history of CAD s/p anterior STEMI 2012 treated with BMS to LAD with CTO of mid LCx, ischemic cardiomyopathy, HTN, HLD, PAD, RBBB, history of penetrating ulcer aortic arch and AAA.  Patient was previously followed by Dr. Flonnie Overman of [facility].  Previous cardiac catheterization at [facility] on 04/29/2011 revealed EF 34%, occluded LAD treated with PTCA/BMS, mid left circumflex occluded, ramus 40%, OM1 50%, proximal RCA 70%, LVEDP 30 mmHg, 2+ MR.  Previous echocardiogram obtained at [facility] on 09/05/2019 revealed EF 40%, akinesis of the inferior and septal wall, hypokinesis of the apical wall, grade 1 DD.  CTA of the chest obtained at [facility] on 08/03/2020 revealed a penetrating ulcer along the distal aortic arch just at the takeoff of the left subclavian artery, mixed atherosclerosis scattered in the ascending and descending aorta.  Patient was recently seen by family medicine on 12/22/2021 complaining of 20 pound unexplained weight loss and worsening dyspnea on exertion.  Blood work showed significant anemia.  Patient was admitted to [facility] on 12/22/2021 for work-up of anemia.  Hemoglobin was 7.1.  Patient received 2 units of packed red blood cell, however had worsening shortness of breath concerning for acute  heart failure exacerbation after the blood transfusion.  Cardiology service was consulted at the time.  Patient underwent IV diuresis.  Repeat echocardiogram obtained on 12/24/2021 revealed EF 20 to 25%, grade 2 DD, akinesis and thinning of inferior wall, inferior septum, anterior septum and apex suggesting infarction in the RCA and LAD territory.  Moderately elevated PASP, severe LAE, small pericardial effusion with no evidence of tamponade, trivial MR, moderate TR.  Despite the significant drop in the LV function, however due to possible GI bleed and the lack of angina, additional ischemic evaluation was deferred.  Initial CTA of chest abdomen and pelvis did not mention any penetrating ulcer in the aorta, however did reveal 3.8 cm infrarenal AAA with thrombus.  There was concern for impending AAA rupture or aortitis, however this was reviewed by vascular surgery and felt the patient did not have any impending rupture or aortitis, outpatient follow-up was recommended.  Patient ultimately underwent GI work-up including EGD and the colonoscopy.  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  Patient was ultimately discharged on Farxiga, low-dose Entresto, Zetia, Lasix, metoprolol succinate 50 mg daily, potassium supplement and spironolactone 12.5 mg daily.  Since discharge, patient has been receiving home PT.  According to his wife, since the patient was released from the hospital last Saturday, he did well for the first 2 days.  However starting on Tuesday, patient became weaker and was unable to stand  up or walk by himself.  He was also not eating due to lack of appetite.  He denied any fever, chill, or chest discomfort.  Wife did note he had a productive cough with sputum.  His weakness gradually worsened.  This morning, he woke up shortly prior to  midnight concerning for weakness and confusion.  He got up to use the portable urinal, however was too weak to hold the urinal.  On EMS arrival, O2 saturation was 85% which improved to 95% on 4 L nasal cannula.  He was also hypotensive in the 70s.  Although his Lasix is listed as 80 mg daily, wife says he will only take one of the 40 mg tablet each day.  On ED arrival, blood pressure was 85/40.  Heart rate was tachycardic in the 100-110s.  Lactic acid 2.2.  White blood cell count was severely elevated at 28.8 with elevated neutrophil count.  Creatinine was 2.21, previous creatinine from 6 days ago was 1.41.  Blood culture obtained and currently pending.  Flu and COVID test negative.  EKG continue to show sinus rhythm with right bundle branch block.  Chest x-ray showed cardiomegaly with pulmonary edema, improved but persistent bilateral pleural effusion.  Patient was initially given a liter of saline, however later was felt to be volume overloaded and given IV Lasix.  Hs troponin was 5581.  Wife says patient denied any abdominal or chest discomfort prior to arrival.  Patient was unable to be interviewed as he was placed on BiPAP and unresponsive to arousal.   Past Medical History:  Diagnosis Date   Congestive heart failure (CHF) ([place])    Coronary artery disease    Diabetes mellitus ([place])    Hyperlipemia    Hypertension    Ischemic cardiomyopathy    MI (myocardial infarction) ([place])    Peripheral vascular disease ([place])     Past Surgical History:  Procedure Laterality Date   BIOPSY  12/31/2021   Procedure: BIOPSY;  Surgeon: [name], MD;  Location: [place];  Service: Gastroenterology;;   COLONOSCOPY N/A 12/31/2021   Procedure: COLONOSCOPY;  Surgeon: [name], MD;  Location: [facility] ENDOSCOPY;  Service: Gastroenterology;  Laterality: N/A;   ESOPHAGOGASTRODUODENOSCOPY N/A 12/31/2021   Procedure: ESOPHAGOGASTRODUODENOSCOPY (EGD);  Surgeon: [name], MD;  Location: [facility] ENDOSCOPY;   Service: Gastroenterology;  Laterality: N/A;   No prior surgery     POLYPECTOMY  12/31/2021   Procedure: POLYPECTOMY;  Surgeon: [name], MD;  Location: [facility] ENDOSCOPY;  Service: Gastroenterology;;     Home Medications:  Prior to Admission medications   Medication Sig Start Date End Date Taking? Authorizing Provider  atorvastatin (LIPITOR) 80 MG tablet Take 80 mg by mouth daily. 11/19/21  Yes [provider]  dapagliflozin propanediol (FARXIGA) 10 MG TABS tablet Take 1 tablet (10 mg total) by mouth daily. 01/01/22  Yes Mercy Riding, MD  diclofenac Sodium (VOLTAREN) 1 % GEL Apply 2 g topically 4 (four) times daily. 01/01/22  Yes Gonfa, Charlesetta Ivory, MD  ENTRESTO 24-26 MG 1 tablet 2 (two) times daily. 12/20/21  Yes [provider]  ezetimibe (ZETIA) 10 MG tablet Take 10 mg by mouth daily. 11/19/21  Yes [provider]  furosemide (LASIX) 40 MG tablet Take 80 mg by mouth daily. 11/19/21  Yes [provider]  metFORMIN (GLUCOPHAGE-XR) 750 MG 24 hr tablet Take 1 tablet (750 mg total) by mouth daily with breakfast. 01/01/22  Yes Gonfa, Taye T, MD  metoprolol succinate (TOPROL-XL) 50 MG 24 hr  tablet Take 1 tablet (50 mg total) by mouth daily. 01/01/22 01/01/23 Yes Mercy Riding, MD  Multiple Vitamin (MULTIVITAMIN) capsule Take 1 capsule by mouth daily.   Yes [provider]  pantoprazole (PROTONIX) 40 MG tablet Take 1 tablet (40 mg total) by mouth daily. 01/01/22  Yes Mercy Riding, MD  potassium chloride (KLOR-CON M) 10 MEQ tablet Take 10 mEq by mouth daily. 02/23/21 02/23/22 Yes [provider]  spironolactone (ALDACTONE) 25 MG tablet Take 0.5 tablets (12.5 mg total) by mouth daily. 01/01/22  Yes Mercy Riding, MD  aspirin EC 81 MG tablet Take 1 tablet (81 mg total) by mouth daily. Swallow whole. Patient not taking: Reported on 12/20/2021 01/08/22 01/08/23  Mercy Riding, MD  nitroGLYCERIN (NITROSTAT) 0.4 MG SL tablet Place 0.4 mg under the tongue every 5 (five)  minutes as needed for chest pain. 04/19/21   [provider]    Inpatient Medications: Scheduled Meds:  aspirin EC  81 mg Oral Daily   atorvastatin  80 mg Oral Daily   dapagliflozin propanediol  10 mg Oral Daily   diclofenac Sodium  2 g Topical QID   enoxaparin (LOVENOX) injection  40 mg Subcutaneous Q24H   ezetimibe  10 mg Oral Daily   furosemide  20 mg Intravenous BID   guaiFENesin  600 mg Oral BID   insulin aspart  0-9 Units Subcutaneous TID AC & HS   ipratropium-albuterol  3 mL Nebulization QID   multivitamin with minerals  1 tablet Oral Daily   pantoprazole  40 mg Oral Daily   potassium chloride  10 mEq Oral Daily   Continuous Infusions:  azithromycin     cefTRIAXone (ROCEPHIN)  IV     lactated ringers Stopped (01/01/2022 0045)   And   lactated ringers Stopped (01/03/2022 0045)   lactated ringers Stopped (01/05/2022 0046)   PRN Meds: acetaminophen **OR** acetaminophen, chlorpheniramine-HYDROcodone, LORazepam, nitroGLYCERIN, ondansetron **OR** ondansetron (ZOFRAN) IV  Allergies:   No Known Allergies  Social History:   Social History   Socioeconomic History   Marital status: Married    Spouse name: Not on file   Number of children: 2   Years of education: Not on file   Highest education level: Not on file  Occupational History   Not on file  Tobacco Use   Smoking status: Former    Types: Cigarettes   Smokeless tobacco: Never  Substance and Sexual Activity   Alcohol use: Never   Drug use: Never   Sexual activity: Not Currently  Other Topics Concern   Not on file  Social History Narrative   Not on file   Social Determinants of Health   Financial Resource Strain: Not on file  Food Insecurity: Not on file  Transportation Needs: Not on file  Physical Activity: Not on file  Stress: Not on file  Social Connections: Not on file  Intimate Partner Violence: Not on file    Family History:    Family History  Problem Relation Age of Onset   Cancer Mother     Heart attack Father    Alcohol abuse Father      ROS:  Please see the history of present illness.   All other ROS reviewed and negative.     Physical Exam/Data:   Vitals:   12/28/2021 1135 12/29/2021 1145 12/30/2021 1200 01/10/2022 1245  BP:  (!) 99/57 (!) 93/54 (!) 91/54  Pulse: (!) 111 (!) 120 (!) 107 (!) 109  Resp: (!) 42 (!)  43 (!) 43 (!) 42  Temp:      SpO2: 98% 97% 98% 97%  Weight:      Height:       No intake or output data in the 24 hours ending 12/23/2021 1314 Last 3 Weights 12/21/2021 01/01/2022 12/31/2021  Weight (lbs) 165 lb 5.5 oz 164 lb 10.9 oz 158 lb 6.4 oz  Weight (kg) 75 kg 74.7 kg 71.85 kg     Body mass index is 22.42 kg/m.  General:  obtunded, unable to respond to question. On BiPAP HEENT: normal Neck: no JVD Vascular: No carotid bruits; Distal pulses 2+ bilaterally Cardiac:  normal S1, S2; RRR; no murmur  Lungs:  diminished breath sound with bilateral rhonchi Abd: soft, nontender, no hepatomegaly  Ext: no edema Musculoskeletal:  No deformities, BUE and BLE strength normal and equal Skin: warm and dry  Neuro:  obtunded, unable to answer any question Psych:  unable to assess  EKG:  The EKG was personally reviewed and demonstrates:  sinus rhythm with RBBB Telemetry:  Telemetry was personally reviewed and demonstrates:  Sinus tachycardia with RBBB, HR 110s  Relevant CV Studies:  Echo 12/24/2021  1. Left ventricular ejection fraction, by estimation, is 20 to 25%. The  left ventricle has severely decreased function. The left ventricle  demonstrates global hypokinesis. The left ventricular internal cavity size  was mildly dilated. Left ventricular  diastolic parameters are consistent with Grade II diastolic dysfunction  (pseudonormalization). Elevated left atrial pressure. Although there is  global hypokinesis, there is near akinesis and thinning of the myocardium  in the inferior wall, inferior  septum, anterior septum and apex, suggesting infarction in the  right  coronary artery and LAD artery territories.   2. Right ventricular systolic function is normal. The right ventricular  size is normal. There is moderately elevated pulmonary artery systolic  pressure.   3. Left atrial size was severely dilated.   4. Right atrial size was mildly dilated.   5. A small pericardial effusion is present. The pericardial effusion is  circumferential. There is no evidence of cardiac tamponade.   6. The mitral valve is normal in structure. Trivial mitral valve  regurgitation.   7. Tricuspid valve regurgitation is moderate.   8. The aortic valve is tricuspid. Aortic valve regurgitation is not  visualized. No aortic stenosis is present.   9. The inferior vena cava is dilated in size with >50% respiratory  variability, suggesting right atrial pressure of 8 mmHg.   Laboratory Data:  High Sensitivity Troponin:   Recent Labs  Lab 12/24/21 1300 12/24/21 1452 12/24/2021 1153  TROPONINIHS 270* 302* 5,581*     Chemistry Recent Labs  Lab 01/01/22 0207 01/11/2022 0010 01/08/2022 0408  NA 135 137 140  K 3.5 3.9 4.6  CL 105 106 111  CO2 19* 17* 11*  GLUCOSE 133* 167* 158*  BUN 19 36* 37*  CREATININE 1.41* 2.21* 2.04*  CALCIUM 8.5* 8.5* 8.5*  MG 2.3  --   --   GFRNONAA 54* 31* 34*  ANIONGAP 11 14 18*    Recent Labs  Lab 01/01/22 0207 12/27/2021 0010  PROT  --  6.5  ALBUMIN 2.3* 2.2*  AST  --  41  ALT  --  35  ALKPHOS  --  82  BILITOT  --  0.5   Lipids No results for input(s): CHOL, TRIG, HDL, LABVLDL, LDLCALC, CHOLHDL in the last 168 hours.  Hematology Recent Labs  Lab 01/01/22 0207 01/07/2022 0010 12/22/2021 0408  WBC 13.4* 28.8* 28.5*  RBC 3.37* 3.55* 3.36*  HGB 9.7* 9.8* 9.4*  HCT 29.7* 31.0* 31.4*  MCV 88.1 87.3 93.5  MCH 28.8 27.6 28.0  MCHC 32.7 31.6 29.9*  RDW 15.3 15.4 15.8*  PLT 154 130* 107*   Thyroid No results for input(s): TSH, FREET4 in the last 168 hours.  BNP Recent Labs  Lab 01/01/22 0207  BNP 411.0*    DDimer No  results for input(s): DDIMER in the last 168 hours.   Radiology/Studies:  DG Chest 2 View  Result Date: 12/22/2021 CLINICAL DATA:  Suspected sepsis. EXAM: CHEST - 2 VIEW COMPARISON:  Radiograph 12/25/2021, CT 12/23/2021 FINDINGS: Cardiomegaly. Moderate interstitial thickening likely pulmonary edema, similar. Persistent but improved bilateral pleural effusions. Trace fluid in the fissures. No pneumothorax. Emphysematous changes better appreciated on prior CT. IMPRESSION: 1. Cardiomegaly with pulmonary edema. 2. Improved but persistent bilateral pleural effusions. Electronically Signed   By: Keith Rake M.D.   On: 12/31/2021 01:20     Assessment and Plan:   Septic shock:  -White blood cell count 28 with significantly elevated neutrophil count.  Elevated lactic acid.  Patient was hypotensive on arrival treated with IV fluid.  Overall picture concerning for severe sepsis with septic shock.  -Although chest x-ray suggest pulmonary edema, however on physical exam, patient does not have any lower extremity edema, no obvious JVD either.  Will discuss with MD.   Addendum: per Dr. Radford Pax, wife does not want any intervention on the patient. He has a very poor prognosis, should get palliative care on board.   Elevated troponin: Troponin 5581.  Family denies any recent report of chest discomfort.  Echocardiogram obtained during the recent admission does show EF dropped from the previous 40% in 2021 down to 20 to 25% recently.  Ischemic work-up was not pursued due to possible GI bleed and severe anemia that required blood transfusion.  -Continue to trend troponin, likely related to respiratory failure and hypotension.  Given her significantly elevated white blood cell count, this may not be ACS but demand ischemia in the setting of septic shock. Either way, he would not be a cath candidate as he is now.   Possible acute heart failure versus pneumonia:  -Initial picture is unclear.  Per family, he has  gained a few pounds since he left the hospital last Saturday.  He has no lower extremity edema and no JVD.  White blood cell count is 28, lactic acid elevated to 2.2 on arrival.  No procalcitonin ordered at this time. -He was initially treated with 1 L of IV fluid for dehydration given elevated creatinine of 2.1, however due to chest x-ray finding and worsening dyspnea, he was given IV Lasix 40 mg +20 mg twice daily dosing thereafter. -He is receiving antibiotic.  Unfortunately patient is obtunded at this point and on BiPAP.  He is unable to answer any questions to respond to stimuli.  Will discuss with MD, this patient is very sick and his overall prognosis is quite poor.  Questioning if need pulmonary critical care assistance and central line to obtain CVP and figure out volume status. - may continue mild diuresis for now. Follow renal function, breathing and mental status closely. Consider procalcitonin, if elevated, may still need PCCM.   CAD s/p anterior STEMI 2012 treated with BMS to LAD with CTO of mid LCx   Ischemic cardiomyopathy: EF 40% in 2020 at [facility], however recent echocardiogram showed EF down to 20 to 25%.  Ischemic work-up was  not pursued at the time as the patient was admitted was symptomatic anemia and he did not have any anginal symptoms.  Heart failure medication was titrated at the time.  HTN: Hypotensive on arrival.  HLD  PAD  RBBB  history of penetrating ulcer aortic arch: Seen on previous CT image at [facility] in 2021, was not mentioned on the most recent CT of chest abdomen and pelvis last week  AAA: Previous CTA obtained last week showed 3.8 infrarenal AAA with thrombus concerning for possible impending rupture or aorsitis, this was reviewed by vascular surgery and felt not to be the case, patient does have chronic thrombus.  AAA can be followed up as outpatient.    Risk Assessment/Risk Scores:        New York Heart Association (NYHA) Functional Class NYHA Class IV         For questions or updates, please contact [facility] Please consult [email or web address] for contact info under    Hilbert Corrigan, [place]  01/01/2022 1:14 PM

## 2022-01-07 NOTE — Sepsis Progress Note (Signed)
Elink following Code Sepsis. 

## 2022-01-08 DIAGNOSIS — J9601 Acute respiratory failure with hypoxia: Secondary | ICD-10-CM | POA: Diagnosis not present

## 2022-01-08 DIAGNOSIS — R778 Other specified abnormalities of plasma proteins: Secondary | ICD-10-CM | POA: Diagnosis not present

## 2022-01-08 DIAGNOSIS — A419 Sepsis, unspecified organism: Secondary | ICD-10-CM | POA: Diagnosis not present

## 2022-01-08 DIAGNOSIS — I255 Ischemic cardiomyopathy: Secondary | ICD-10-CM | POA: Diagnosis not present

## 2022-01-08 LAB — URINALYSIS, COMPLETE (UACMP) WITH MICROSCOPIC
Bilirubin Urine: NEGATIVE
Glucose, UA: 150 mg/dL — AB
Hgb urine dipstick: NEGATIVE
Ketones, ur: NEGATIVE mg/dL
Leukocytes,Ua: NEGATIVE
Nitrite: NEGATIVE
Protein, ur: NEGATIVE mg/dL
Specific Gravity, Urine: 1.013 (ref 1.005–1.030)
pH: 5 (ref 5.0–8.0)

## 2022-01-08 LAB — GLUCOSE, CAPILLARY: Glucose-Capillary: 105 mg/dL — ABNORMAL HIGH (ref 70–99)

## 2022-01-08 LAB — LACTIC ACID, PLASMA: Lactic Acid, Venous: 1.5 mmol/L (ref 0.5–1.9)

## 2022-01-08 LAB — STREP PNEUMONIAE URINARY ANTIGEN: Strep Pneumo Urinary Antigen: NEGATIVE

## 2022-01-08 MED ORDER — IPRATROPIUM-ALBUTEROL 0.5-2.5 (3) MG/3ML IN SOLN: 3.0000 mL | Freq: Three times a day (TID) | RESPIRATORY_TRACT | Status: DC

## 2022-01-08 MED ORDER — SODIUM CHLORIDE 0.9 % IV BOLUS: 1000.0000 mL | Freq: Once | INTRAVENOUS | Status: DC

## 2022-01-08 MED ORDER — LORAZEPAM 2 MG/ML IJ SOLN: 2.0000 mg | INTRAMUSCULAR | Status: DC | PRN

## 2022-01-08 MED ORDER — MORPHINE 100MG IN NS 100ML (1MG/ML) PREMIX INFUSION
5.0000 mg/h | INTRAVENOUS | Status: DC
  Administered 2022-01-08: 5 mg/h via INTRAVENOUS

## 2022-01-08 NOTE — Progress Notes (Signed)
Took patient off of the BiPAP per MD's order.

## 2022-01-08 NOTE — Progress Notes (Signed)
Attempted to draw ABG per order, patient is very agitated twisting and moving is arms violently, RT unable to stick due to harm of patient and staff

## 2022-01-08 NOTE — Progress Notes (Signed)
Progress Note  Patient Name: Bob Johnson Date of Encounter: 01/08/2022  [facility] Cardiologist: Kirk Ruths, MD   Subjective   Very agitated and remains obtunded.  Currently on NRB mask  Inpatient Medications    Scheduled Meds:  ipratropium-albuterol  3 mL Nebulization TID   Continuous Infusions:  morphine     PRN Meds: acetaminophen **OR** acetaminophen, chlorpheniramine-HYDROcodone, LORazepam, ondansetron **OR** ondansetron (ZOFRAN) IV   Vital Signs    Vitals:   01/08/22 0607 01/08/22 0804 01/08/22 0826 01/08/22 0827  BP:  (!) 97/47    Pulse:  100  (!) 105  Resp:  17  (!) 30  Temp: 99 F (37.2 C) 98.5 F (36.9 C)    TempSrc: Axillary Axillary    SpO2:  95% 97% 96%  Weight:      Height:        Intake/Output Summary (Last 24 hours) at 01/08/2022 1038 Last data filed at 01/08/2022 0256 Gross per 24 hour  Intake --  Output 850 ml  Net -850 ml   Last 3 Weights 01/03/2022 01/01/2022 12/31/2021  Weight (lbs) 165 lb 5.5 oz 164 lb 10.9 oz 158 lb 6.4 oz  Weight (kg) 75 kg 74.7 kg 71.85 kg      Telemetry    NSR with PACs - Personally Reviewed  ECG    No new EKG to review - Personally Reviewed  Physical Exam   GEN: very frail and ill appearing Neck: No JVD Cardiac: RRR, no murmurs, rubs, or gallops. Occasional ectopy Respiratory: Clear anteriorly GI: Soft, nontender, non-distended  MS: No edema; No deformity. Neuro: cannot assess>>Obstunded Psych: cannot assess  Labs    High Sensitivity Troponin:   Recent Labs  Lab 12/24/21 1300 12/24/21 1452 12/27/2021 1153 12/22/2021 1431  TROPONINIHS 270* 302* 5,581* 7,142*      Chemistry Recent Labs  Lab 01/02/2022 0010 12/23/2021 0408  NA 137 140  K 3.9 4.6  CL 106 111  CO2 17* 11*  GLUCOSE 167* 158*  BUN 36* 37*  CREATININE 2.21* 2.04*  CALCIUM 8.5* 8.5*  PROT 6.5  --   ALBUMIN 2.2*  --   AST 41  --   ALT 35  --   ALKPHOS 82  --   BILITOT 0.5  --   GFRNONAA 31* 34*  ANIONGAP 14 18*      Hematology Recent Labs  Lab 12/27/2021 0010 12/27/2021 0408  WBC 28.8* 28.5*  RBC 3.55* 3.36*  HGB 9.8* 9.4*  HCT 31.0* 31.4*  MCV 87.3 93.5  MCH 27.6 28.0  MCHC 31.6 29.9*  RDW 15.4 15.8*  PLT 130* 107*    BNPNo results for input(s): BNP, PROBNP in the last 168 hours.   DDimer No results for input(s): DDIMER in the last 168 hours.   Radiology    DG Chest 2 View  Result Date: 12/17/2021 CLINICAL DATA:  Suspected sepsis. EXAM: CHEST - 2 VIEW COMPARISON:  Radiograph 12/25/2021, CT 12/23/2021 FINDINGS: Cardiomegaly. Moderate interstitial thickening likely pulmonary edema, similar. Persistent but improved bilateral pleural effusions. Trace fluid in the fissures. No pneumothorax. Emphysematous changes better appreciated on prior CT. IMPRESSION: 1. Cardiomegaly with pulmonary edema. 2. Improved but persistent bilateral pleural effusions. Electronically Signed   By: Keith Rake M.D.   On: 12/16/2021 01:20    Cardiac Studies   2D echo 12/2021 IMPRESSIONS   1. Left ventricular ejection fraction, by estimation, is 20 to 25%. The  left ventricle has severely decreased function. The left ventricle  demonstrates global hypokinesis.  The left ventricular internal cavity size  was mildly dilated. Left ventricular  diastolic parameters are consistent with Grade II diastolic dysfunction  (pseudonormalization). Elevated left atrial pressure. Although there is  global hypokinesis, there is near akinesis and thinning of the myocardium  in the inferior wall, inferior  septum, anterior septum and apex, suggesting infarction in the right  coronary artery and LAD artery territories.   2. Right ventricular systolic function is normal. The right ventricular  size is normal. There is moderately elevated pulmonary artery systolic  pressure.   3. Left atrial size was severely dilated.   4. Right atrial size was mildly dilated.   5. A small pericardial effusion is present. The pericardial  effusion is  circumferential. There is no evidence of cardiac tamponade.   6. The mitral valve is normal in structure. Trivial mitral valve  regurgitation.   7. Tricuspid valve regurgitation is moderate.   8. The aortic valve is tricuspid. Aortic valve regurgitation is not  visualized. No aortic stenosis is present.   9. The inferior vena cava is dilated in size with >50% respiratory  variability, suggesting right atrial pressure of 8 mmHg.   Patient Profile     71 y.o. male with a hx of CAD s/p anterior STEMI 2012 treated with BMS to LAD with CTO of mid LCx, ischemic cardiomyopathy, HTN, HLD, PAD, RBBB, history of penetrating ulcer aortic arch and AAA who is being seen 01/11/2022 for the evaluation of possible CHF and elevated troponin at the request of Dr. Shanon Brow  Assessment & Plan    Septic shock:       -White blood cell count 28 with significantly elevated neutrophil count.  Elevated lactic acid.  Patient was hypotensive on arrival treated with IV fluid.  Overall picture concerning for severe sepsis with septic shock.             -Although chest x-ray suggest pulmonary edema, however on physical exam, patient does not have any lower extremity edema, no obvious JVD either and I do not think that this is acute CHF but rather severe multifocal PNA in setting of markedly elevated WBC             -he is critically ill and family does not want any further interventions done.  His wife is at the bedside today and wants to make him comfort care   Elevated troponin: Troponin 5581.  Family denies any recent report of chest discomfort.  Echocardiogram obtained during the recent admission does show EF dropped from the previous 40% in 2021 down to 20 to 25% recently.  Ischemic work-up was not pursued due to possible GI bleed and severe anemia that required blood transfusion.  -Continue to trend troponin, likely related to respiratory failure and hypotension.  Given his significantly elevated white blood  cell count, this is not ACS but demand ischemia in the setting of septic shock. Either way, he is not a cath candidate and his family does not want any further procedures   Possible acute heart failure versus pneumonia:       -Initial picture is unclear.  Per family, he has gained a few pounds since he left the hospital last Saturday.  He has no lower extremity edema and no JVD.  White blood cell count is 28, lactic acid elevated to 2.2 on arrival.  No procalcitonin ordered at this time. -He was initially treated with 1 L of IV fluid for dehydration given elevated creatinine of 2.1, however due to  chest x-ray finding and worsening dyspnea, he was given IV Lasix 40 mg +20 mg twice daily dosing thereafter. -He is receiving antibiotic.  Unfortunately patient is obtunded at this point and on BiPAP.  He is unable to answer any questions to respond to stimuli.     CAD s/p anterior STEMI 2012 treated with BMS to LAD with CTO of mid LCx    Ischemic cardiomyopathy: EF 40% in 2020 at [facility], however recent echocardiogram showed EF down to 20 to 25%.  Ischemic work-up was not pursued at the time as the patient was admitted was symptomatic anemia and he did not have any anginal symptoms.  Heart failure medication was titrated at the time.   There has been no change in patient's condition since admission.  He is critically ill with recent dx of gastric adenoCA, has severe IDCM with chronic CHF and now admitted with septic shock likely from multifocal PNA with refractory hypotension and acute hypoxic respiratory failure, markedly elevated WBC and AKI.  He is not a candidate for advanced therapies for HR or cath for elevated hsTrop.  The family wishes for patient to be made comfort care>>defer to [facility]  Will sign off.    For questions or updates, please contact [place] Please consult [email or web address] for contact info under        Signed, Fransico Him, MD  01/08/2022, 10:38 AM

## 2022-01-08 NOTE — Progress Notes (Signed)
Nutrition Brief Note  Chart reviewed. Pt now transitioning to comfort care.  No further nutrition interventions planned at this time.  Please re-consult as needed.   Chatham Howington W, RD, LDN, CDCES Registered Dietitian II Certified Diabetes Care and Education Specialist Please refer to AMION for RD and/or RD on-call/weekend/after hours pager   

## 2022-01-08 NOTE — Progress Notes (Signed)
[place]   PATIENT NAME: Bob Johnson    MR#:  [id number]  DATE OF BIRTH:  1951-02-16  DATE OF ADMISSION:  01/10/2022  PRIMARY CARE PHYSICIAN: Fransisca Connors, FNP   Patient is coming from: Home  REQUESTING/REFERRING PHYSICIAN: Deno Etienne, DO  CHIEF COMPLAINT:   Chief Complaint  Patient presents with   Weakness  Shortness of breath  HISTORY OF PRESENT ILLNESS:  Bob Johnson is a 71 y.o. Caucasian male with medical history significant for combined systolic and diastolic CHF, coronary artery disease, type 2 diabetes mellitus, hypertension, dyslipidemia, ischemic, neuropathy and peripheral vascular disease, who presented to the ER with acute onset of worsening weakness and dyspnea especially on exertion and hypotension in the 70s with new hypoxia requiring 4 L of O2 by nasal cannula.  The patient was recently admitted here for acute on chronic systolic CHF and acute respiratory failure with hypoxia.  He has been doing well since his discharge and has been ambulating with PT at home.  This morning at around 3 AM he got short of breath and was unable to ambulate per his wife.  He admitted to cough and wheezing.  She stated that he had nausea and vomiting once with mucus.  No fever or chills.  No diarrhea or melena or bright red blood per rectum.  No dysuria, oliguria or hematuria or flank pain.  His wife was giving most of the history as the patient was mildly confused.  The patient was respiratory distress in the ER and therefore placed on BiPAP  ED Course: When he came to the ER, BP was 85/40 with a heart rate of 95 and respiratory to 22 and later heart rate was 104 and BP was 99/66 than 90/73.  Labs revealed a BUN of 36 and creatinine 2.21 compared to 19/1.41 on 2/18 and blood glucose was 167 with CO2 of 17 compared to 19 done.  Lactic acid was 2.2 and CBC showed leukocytosis 28.8 with neutrophilia and anemia with hemoglobin 9.8 hematocrit 31 comparable to previous levels and  platelets of 130 compared to 154.  INR is 1.4 and PT 17.4 with PTT 27.  Influenza antigens and COVID-19 PCR came back negative.  Blood cultures were drawn.  Lactic acid level was 2.5 EKG as reviewed by me : EKG showed normal sinus rhythm with a rate of 92 with PACs, right bundle branch block with Q waves inferiorly. Imaging: Chest ray showed cardiomegaly with pulmonary edema and improved but persistent bilateral pleural effusions.  I cannot rule out underlying pneumonia.  The patient was given 40 mg of IV Lasix, 2 L bolus of IV lactated Ringer and 500 mL additional bolus as well as IV  Rocephin and Zithromax.  He will be admitted to a progressive unit bed for further evaluation and management.     Blood pressure (!) 97/47, pulse (!) 105, temperature 98.5 F (36.9 C), temperature source Axillary, resp. rate (!) 30, height 6' (1.829 m), weight 75 kg, SpO2 96 %.  PHYSICAL EXAMINATION:  Physical Exam  GENERAL: Acutely ill, restless 71 y.o.-year-old male patient lying in the bed with moderate respiratory distress on BiPAP. EYES: Pupils equal, round, reactive to light and accommodation. No scleral icterus. Extraocular muscles intact.  HEENT: Head atraumatic, normocephalic. Oropharynx and nasopharynx clear.  NECK:  Supple, no jugular venous distention. No thyroid enlargement, no tenderness.  LUNGS: Diminished bibasilar breath sounds with bibasal crackles. CARDIOVASCULAR: Regular rate and rhythm, S1, S2 normal. No murmurs, rubs,  or gallops.  ABDOMEN: Soft, nondistended, nontender. Bowel sounds present. No organomegaly or mass.  EXTREMITIES: No pedal edema, cyanosis, or clubbing.  NEUROLOGIC: Cranial nerves II through XII are intact. Muscle strength 5/5 in all extremities. Sensation intact. Gait not checked.  PSYCHIATRIC: The patient is alert and oriented x 3.  Normal affect and good eye contact. SKIN: No obvious rash, lesion, or ulcer.   LABORATORY PANEL:   CBC Recent Labs  Lab 01/01/2022 0408   WBC 28.5*  HGB 9.4*  HCT 31.4*  PLT 107*    ------------------------------------------------------------------------------------------------------------------  Chemistries  Recent Labs  Lab 12/27/2021 0010 01/11/2022 0408  NA 137 140  K 3.9 4.6  CL 106 111  CO2 17* 11*  GLUCOSE 167* 158*  BUN 36* 37*  CREATININE 2.21* 2.04*  CALCIUM 8.5* 8.5*  AST 41  --   ALT 35  --   ALKPHOS 82  --   BILITOT 0.5  --     ------------------------------------------------------------------------------------------------------------------  Cardiac Enzymes No results for input(s): TROPONINI in the last 168 hours. ------------------------------------------------------------------------------------------------------------------  RADIOLOGY:  DG Chest 2 View  Result Date: 12/27/2021 CLINICAL DATA:  Suspected sepsis. EXAM: CHEST - 2 VIEW COMPARISON:  Radiograph 12/25/2021, CT 12/23/2021 FINDINGS: Cardiomegaly. Moderate interstitial thickening likely pulmonary edema, similar. Persistent but improved bilateral pleural effusions. Trace fluid in the fissures. No pneumothorax. Emphysematous changes better appreciated on prior CT. IMPRESSION: 1. Cardiomegaly with pulmonary edema. 2. Improved but persistent bilateral pleural effusions. Electronically Signed   By: Keith Rake M.D.   On: 12/17/2021 01:20      IMPRESSION AND PLAN:  Principal Problem:   Acute respiratory failure ([place])  1.  Acute hypoxic respiratory failure requiring BiPAP likely secondary to acute on chronic combined systolic and diastolic CHF as well as possible underlying Community-acquired pneumonia with bilateral pleural effusions and subsequent sepsis as manifested by tachycardia and tachypnea with significant leukocytosis.  He meets criteria for severe sepsis given lactic acid of 2.5 and hypotension. - The patient will be admitted to a progressive unit bed. - We will continue antibiotic therapy with IV Rocephin and Zithromax. -  Mucolytic therapy and bronchodilator therapy will be provided. - We will follow blood and sputum cultures. - We will follow pneumonia antigens. - We will gently diurese with improvement of his blood pressure. - Recent 2D echo revealed an EF of 20 to 25% grade 2 diastolic dysfunction, left atrial severe dilatation and right atrial mild dilatation with moderate 3 elevated pulmonary arterial systolic pressure and trivial mitral valve regurgitation with moderate tricuspid regurgitation. - We will continue with Delene Loll, Farxiga, Aldactone and Toprol-XL.  2.  Elevated troponin I. - While this could be related  to his acute CHF, will need to rule out ACS. - We will obtain a 2D echo and a cardiology consult. - I notified [name] as well as [name].  3.  Dyslipidemia. - We will continue statin therapy.  4.  Coronary artery disease. - We will continue his statin therapy and Zetia, Toprol-XL, and as needed sublingual nitroglycerin.  5.  GERD. - We will continue PPI therapy.  Have had thorough discussion with wife at the bedside along with his daughter his son and his daughter-in-law.  I have gone over his overall poor prognosis and how he is not gotten better overnight.  His wife now wishes to switch him to more full comfort care which is what his children want.  They all stated that he would not want aggressive treatment and he  would not want to be kept alive in prolonged unnecessarily especially in the setting of his new diagnosis of stomach cancer.  He has continued to deteriorate through the night despite antibiotics and BiPAP support he is failing BiPAP.  He is a DNR.  They wish to stop everything except comfort measures only.  I have specifically discussed every modality of treatment they wish to stop BiPAP, stop IV fluids, stop antibiotics, placed on a morphine drip and Ativan and allow a peaceful passing.  I have updated nursing staff about plan of care.  Comfort care orders  placed.  Everything discontinued except as needed Ativan and started morphine drip.  Total time 55 minutes   Bob Johnson A M.D on 01/08/2022 at 12:07 PM  CC: Primary care physician; Fransisca Connors, FNP

## 2022-01-10 LAB — LEGIONELLA PNEUMOPHILA SEROGP 1 UR AG: L. pneumophila Serogp 1 Ur Ag: NEGATIVE

## 2022-01-12 LAB — CULTURE, BLOOD (ROUTINE X 2)
Culture: NO GROWTH
Culture: NO GROWTH
Special Requests: ADEQUATE
Special Requests: ADEQUATE

## 2022-01-12 NOTE — Progress Notes (Signed)
HOSPITAL MEDICINE OVERNIGHT EVENT NOTE    Notified by nursing that patient unfortunately became unresponsive and lost a pulse.  2 nurses went to evaluate patient at bedside and pronounced the patient at time of death being 4:14 AM.  Chart reviewed, patient was suffering from acute hypoxic respiratory failure secondary to acute on chronic systolic and diastolic congestive heart failure with concurrent sepsis and community-acquired pneumonia.  Family was at the bedside at time of death.  Nursing reports that all of their concerns and questions were addressed to their satisfaction.  Mortality services has been notified.  Vernelle Emerald  MD [facility]

## 2022-01-12 NOTE — Progress Notes (Addendum)
At 0414 telemetry monitor called and stated she had pt at asystole. Went into room with Carrie Mew, Camera operator. Pronounced patient's death by no heartbeat and no breath sounds at 0414. Pt's wife and daughter at bedside at time of death. Notifed oncall MD: Dr. Cyd Silence of pt's death.Patient's morphine drip is empty, no morphine to waste, verified by Carrie Mew, CN. Postmortem care done. Notified patient placement of death. Pt transferred to [facility] at [facility]. Pt has wedding band on ring finger, left ring on patient.  Racheal Patches RN

## 2022-01-12 NOTE — Discharge Summary (Signed)
Physician Discharge Summary    Bob Johnson LDJ:[id number] DOB: 1951/06/23 DOA: 2022-01-29  PCP: Bob Connors, FNP  Admit date: 01-29-22 Death date: 31-Jan-2022 Time of death documented 4:14 AM  Death summary Time spent: 35 minutes  Discharge Diagnoses:  Principal Problem:   Acute respiratory failure [facility])   Filed Weights   01-29-22 0008 Jan 31, 2022 0414  Weight: 75 kg 75 kg    History of present illness:  Bob Johnson is a 71 y.o. Caucasian male with medical history significant for combined systolic and diastolic CHF, coronary artery disease, type 2 diabetes mellitus, hypertension, dyslipidemia, ischemic, neuropathy and peripheral vascular disease, who presented to the ER with acute onset of worsening weakness and dyspnea especially on exertion and hypotension in the 70s with new hypoxia requiring 4 L of O2 by nasal cannula.  The patient was recently admitted here for acute on chronic systolic CHF and acute respiratory failure with hypoxia.  He has been doing well since his discharge and has been ambulating with PT at home.  This morning at around 3 AM he got short of breath and was unable to ambulate per his wife.  He admitted to cough and wheezing.  She stated that he had nausea and vomiting once with mucus.  No fever or chills.  No diarrhea or melena or bright red blood per rectum.  No dysuria, oliguria or hematuria or flank pain.  His wife was giving most of the history as the patient was mildly confused.  The patient was respiratory distress in the ER and therefore placed on BiPAP   ED Course: When he came to the ER, BP was 85/40 with a heart rate of 95 and respiratory to 22 and later heart rate was 104 and BP was 99/66 than 90/73.  Labs revealed a BUN of 36 and creatinine 2.21 compared to 19/1.41 on 2/18 and blood glucose was 167 with CO2 of 17 compared to 19 done.  Lactic acid was 2.2 and CBC showed leukocytosis 28.8 with neutrophilia and anemia with hemoglobin 9.8 hematocrit 31  comparable to previous levels and platelets of 130 compared to 154.  INR is 1.4 and PT 17.4 with PTT 27.  Influenza antigens and COVID-19 PCR came back negative.  Blood cultures were drawn.  Lactic acid level was 2.5 EKG as reviewed by me : EKG showed normal sinus rhythm with a rate of 92 with PACs, right bundle branch block with Q waves inferiorly. Imaging: Chest ray showed cardiomegaly with pulmonary edema and improved but persistent bilateral pleural effusions.  I cannot rule out underlying pneumonia.  The patient was given 40 mg of IV Lasix, 2 L bolus of IV lactated Ringer and 500 mL additional bolus as well as IV  Rocephin and Zithromax.  He will be admitted to a progressive unit bed for further evaluation and management.      Hospital Course:  1.  Acute hypoxic respiratory failure requiring BiPAP likely secondary to acute on chronic combined systolic and diastolic CHF as well as possible underlying Community-acquired pneumonia with bilateral pleural effusions and subsequent sepsis as manifested by tachycardia and tachypnea with significant leukocytosis.  He meets criteria for severe sepsis given lactic acid of 2.5 and hypotension. - The patient will be admitted to a progressive unit bed. - We will continue antibiotic therapy with IV Rocephin and Zithromax. - Mucolytic therapy and bronchodilator therapy will be provided. - We will follow blood and sputum cultures. - We will follow pneumonia antigens. - We will gently diurese  with improvement of his blood pressure. - Recent 2D echo revealed an EF of 20 to 72% grade 2 diastolic dysfunction, left atrial severe dilatation and right atrial mild dilatation with moderate 3 elevated pulmonary arterial systolic pressure and trivial mitral valve regurgitation with moderate tricuspid regurgitation. - We will continue with Bob Johnson, Farxiga, Aldactone and Toprol-XL.  2.  Elevated troponin I. - While this could be related  to his acute CHF, will need to  rule out ACS. - We will obtain a 2D echo and a cardiology consult. - I notified Bob Johnson as well as Bob Johnson.  3.  Dyslipidemia. - We will continue statin therapy.  4.  Coronary artery disease. - We will continue his statin therapy and Zetia, Toprol-XL, and as needed sublingual nitroglycerin.  5.  GERD. - We will continue PPI therapy.   Have had thorough discussion with wife at the bedside along with his daughter his son and his daughter-in-law.  I have gone over his overall poor prognosis and how he is not gotten better overnight.  His wife now wishes to switch him to more full comfort care which is what his children want.  They all stated that he would not want aggressive treatment and he would not want to be kept alive in prolonged unnecessarily especially in the setting of his new diagnosis of stomach cancer.  He has continued to deteriorate through the night despite antibiotics and BiPAP support he is failing BiPAP.  He is a DNR.  They wish to stop everything except comfort measures only.  I have specifically discussed every modality of treatment they wish to stop BiPAP, stop IV fluids, stop antibiotics, placed on a morphine drip and Ativan and allow a peaceful passing.  I have updated nursing staff about plan of care.  Comfort care orders placed.  Everything discontinued except as needed Ativan and started morphine drip.  Patient expired Feb 07, 2022 at 4:14 AM approximately with family at bedside.  Primary diagnosis being septic shock with congestive heart failure and NSTEMI    Discharge Exam: Vitals:   01/08/22 0827 01/08/22 2315  BP:  (!) 65/42  Pulse: (!) 105 (!) 114  Resp: (!) 30 14  Temp:  98.1 F (36.7 C)  SpO2: 96% (!) 87%   Discharge Instructions    Allergies as of Feb 07, 2022   No Known Allergies      Medication List     ASK your doctor about these medications    aspirin EC 81 MG tablet Take 1 tablet (81 mg total) by mouth daily. Swallow whole.    atorvastatin 80 MG tablet Commonly known as: LIPITOR Take 80 mg by mouth daily.   dapagliflozin propanediol 10 MG Tabs tablet Commonly known as: FARXIGA Take 1 tablet (10 mg total) by mouth daily.   diclofenac Sodium 1 % Gel Commonly known as: VOLTAREN Apply 2 g topically 4 (four) times daily.   Entresto 24-26 MG Generic drug: sacubitril-valsartan 1 tablet 2 (two) times daily.   ezetimibe 10 MG tablet Commonly known as: ZETIA Take 10 mg by mouth daily.   furosemide 40 MG tablet Commonly known as: LASIX Take 80 mg by mouth daily.   metFORMIN 750 MG 24 hr tablet Commonly known as: GLUCOPHAGE-XR Take 1 tablet (750 mg total) by mouth daily with breakfast.   metoprolol succinate 50 MG 24 hr tablet Commonly known as: TOPROL-XL Take 1 tablet (50 mg total) by mouth daily.   multivitamin capsule Take 1 capsule by mouth daily.  nitroGLYCERIN 0.4 MG SL tablet Commonly known as: NITROSTAT Place 0.4 mg under the tongue every 5 (five) minutes as needed for chest pain.   pantoprazole 40 MG tablet Commonly known as: PROTONIX Take 1 tablet (40 mg total) by mouth daily.   potassium chloride 10 MEQ tablet Commonly known as: KLOR-CON M Take 10 mEq by mouth daily.   spironolactone 25 MG tablet Commonly known as: ALDACTONE Take 0.5 tablets (12.5 mg total) by mouth daily.       No Known Allergies    The results of significant diagnostics from this hospitalization (including imaging, microbiology, ancillary and laboratory) are listed below for reference.    Significant Diagnostic Studies: DG Chest 2 View  Result Date: 12/22/2021 CLINICAL DATA:  Suspected sepsis. EXAM: CHEST - 2 VIEW COMPARISON:  Radiograph 12/25/2021, CT 12/23/2021 FINDINGS: Cardiomegaly. Moderate interstitial thickening likely pulmonary edema, similar. Persistent but improved bilateral pleural effusions. Trace fluid in the fissures. No pneumothorax. Emphysematous changes better appreciated on prior CT.  IMPRESSION: 1. Cardiomegaly with pulmonary edema. 2. Improved but persistent bilateral pleural effusions. Electronically Signed   By: Keith Rake M.D.   On: 12/18/2021 01:20   DG Chest 2 View  Result Date: 12/25/2021 CLINICAL DATA:  Acute congestive heart failure. EXAM: CHEST - 2 VIEW COMPARISON:  12/23/2021. FINDINGS: Since the prior study there has been no convincing change allowing for differences in patient positioning and technique. There are persistent opacities that extend from the perihilar regions to the lung bases, obscuring hemidiaphragms. Lung opacities are associated with moderate bilateral pleural effusions. There is also bilateral vascular congestion and interstitial thickening. No new lung abnormalities.  No pneumothorax. IMPRESSION: 1. No significant change from the exam is dated 12/23/2021. 2. Persistent lung opacities including vascular congestion and interstitial thickening, consistent with combination interstitial pulmonary edema, lung base atelectasis and moderate effusions. Electronically Signed   By: Lajean Manes M.D.   On: 12/25/2021 08:42   DG CHEST PORT 1 VIEW  Result Date: 12/23/2021 CLINICAL DATA:  Tachypnea EXAM: PORTABLE CHEST 1 VIEW COMPARISON:  CT 12/23/2021 FINDINGS: Cardiomegaly with vascular congestion and diffuse interstitial and ground-glass opacities suspicious for pulmonary edema. Small moderate bilateral effusions. Airspace disease at the bases. No pneumothorax. IMPRESSION: 1. Cardiomegaly with vascular congestion and diffuse interstitial and ground-glass opacity suspicious for pulmonary edema. Small-moderate bilateral effusions with basilar airspace disease Electronically Signed   By: Donavan Foil M.D.   On: 12/23/2021 17:02   ECHOCARDIOGRAM COMPLETE  Result Date: 12/24/2021    ECHOCARDIOGRAM REPORT   Patient Name:   MILIND RAETHER Date of Exam: 12/24/2021 Medical Rec #:  [id number]    Height:       73.0 in Accession #:    [id number]   Weight:       169.8 lb  Date of Birth:  01/01/1951    BSA:          2.007 m Patient Age:    24 years     BP:           108/69 mmHg Patient Gender: M            HR:           101 bpm. Exam Location:  Inpatient Procedure: 2D Echo, Cardiac Doppler, Color Doppler and Intracardiac            Opacification Agent Indications:    CHF  History:        Patient has no prior history of Echocardiogram examinations.  CAD; Risk Factors:Hypertension and Diabetes.  Sonographer:    Jyl Heinz Referring Phys: [place]  1. Left ventricular ejection fraction, by estimation, is 20 to 25%. The left ventricle has severely decreased function. The left ventricle demonstrates global hypokinesis. The left ventricular internal cavity size was mildly dilated. Left ventricular diastolic parameters are consistent with Grade II diastolic dysfunction (pseudonormalization). Elevated left atrial pressure. 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  2. Right ventricular systolic function is normal. The right ventricular size is normal. There is moderately elevated pulmonary artery systolic pressure.  3. Left atrial size was severely dilated.  4. Right atrial size was mildly dilated.  5. A small pericardial effusion is present. The pericardial effusion is circumferential. There is no evidence of cardiac tamponade.  6. The mitral valve is normal in structure. Trivial mitral valve regurgitation.  7. Tricuspid valve regurgitation is moderate.  8. The aortic valve is tricuspid. Aortic valve regurgitation is not visualized. No aortic stenosis is present.  9. The inferior vena cava is dilated in size with >50% respiratory variability, suggesting right atrial pressure of 8 mmHg. FINDINGS  Left Ventricle: No left ventricular thrombus is seen (definity  contrast used). Left ventricular ejection fraction, by estimation, is 20 to 25%. The left ventricle has severely decreased function. The left ventricle demonstrates global hypokinesis. The left ventricular internal cavity size was mildly dilated. There is no left ventricular hypertrophy. Left ventricular diastolic parameters are consistent with Grade II diastolic dysfunction (pseudonormalization). Elevated left atrial pressure.  LV Wall Scoring: The inferior septum, entire inferior wall, basal anteroseptal segment, apical anterior segment, and apex are akinetic. The anterior wall, entire lateral wall, and mid anteroseptal segment are hypokinetic. 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 Right Ventricle: The right ventricular size is normal. No increase in right ventricular wall thickness. Right ventricular systolic function is normal. There is moderately elevated pulmonary artery systolic pressure. The tricuspid regurgitant velocity is 3.27 m/s, and with an assumed right atrial pressure of 8 mmHg, the estimated right ventricular systolic pressure is 40.0 mmHg. Left Atrium: Left atrial size was severely dilated. Right Atrium: Right atrial size was mildly dilated. Pericardium: A small pericardial effusion is present. The pericardial effusion is circumferential. There is no evidence of cardiac tamponade. Mitral Valve: The mitral valve is normal in structure. Trivial mitral valve regurgitation. Tricuspid Valve: The tricuspid valve is normal in structure. Tricuspid valve regurgitation is moderate. Aortic Valve: The aortic valve is tricuspid. Aortic valve regurgitation is not visualized. No aortic stenosis is present. Aortic valve peak gradient measures 7.1 mmHg. Pulmonic Valve: The pulmonic valve was grossly normal. Pulmonic  valve regurgitation is not visualized. Aorta: The aortic root and ascending aorta are structurally normal, with no evidence of dilitation. Venous: The inferior vena cava is dilated in size with greater than 50% respiratory variability, suggesting right atrial pressure of 8 mmHg. IAS/Shunts: No atrial level shunt detected by color flow Doppler.  LEFT VENTRICLE PLAX 2D LVIDd:         5.90 cm      Diastology LVIDs:         5.00 cm      LV e'  medial:    9.64 cm/s LV PW:         1.00 cm      LV E/e' medial:  9.2 LV IVS:        0.70 cm      LV e' lateral:   11.20 cm/s LVOT diam:     2.00 cm      LV E/e' lateral: 7.9 LV SV:         46 LV SV Index:   23 LVOT Area:     3.14 cm  LV Volumes (MOD) LV vol d, MOD A2C: 237.0 ml LV vol d, MOD A4C: 198.0 ml LV vol s, MOD A2C: 174.0 ml LV vol s, MOD A4C: 146.0 ml LV SV MOD A2C:     63.0 ml LV SV MOD A4C:     198.0 ml LV SV MOD BP:      59.3 ml RIGHT VENTRICLE             IVC RV Basal diam:  3.60 cm     IVC diam: 2.20 cm RV Mid diam:    2.70 cm RV S prime:     13.40 cm/s TAPSE (M-mode): 1.9 cm LEFT ATRIUM             Index        RIGHT ATRIUM           Index LA diam:        3.90 cm 1.94 cm/m   RA Area:     18.00 cm LA Vol (A2C):   59.3 ml 29.55 ml/m  RA Volume:   48.90 ml  24.37 ml/m LA Vol (A4C):   65.2 ml 32.49 ml/m LA Biplane Vol: 66.7 ml 33.24 ml/m  AORTIC VALVE AV Area (Vmax): 2.26 cm AV Vmax:        133.00 cm/s AV Peak Grad:   7.1 mmHg LVOT Vmax:      95.80 cm/s LVOT Vmean:     61.900 cm/s LVOT VTI:       0.146 m  AORTA Ao Root diam: 3.60 cm Ao Asc diam:  2.80 cm MITRAL VALVE               TRICUSPID VALVE MV Area (PHT): 4.99 cm    TR Peak grad:   42.8 mmHg MV Decel Time: 152 msec    TR Vmax:        327.00 cm/s MV E velocity: 88.60 cm/s MV A velocity: 64.50 cm/s  SHUNTS MV E/A ratio:  1.37        Systemic VTI:  0.15 m                            Systemic Diam: 2.00 cm Dani Gobble Croitoru MD Electronically signed by Sanda Klein MD Signature Date/Time: 12/24/2021/12:42:44 PM     Final    CT Angio Chest/Abd/Pel for Dissection W and/or W/WO  Result Date: 12/23/2021 CLINICAL DATA:  Aortic dissection, clinical status change EXAM: CT ANGIOGRAPHY CHEST, ABDOMEN AND PELVIS TECHNIQUE: Non-contrast CT of the chest was initially obtained. Multidetector CT imaging through the chest, abdomen and pelvis was performed using the standard protocol during bolus administration of intravenous contrast. Multiplanar reconstructed images and MIPs were obtained and reviewed to evaluate the vascular anatomy. RADIATION DOSE REDUCTION: This exam was performed according to the departmental dose-optimization program which includes automated exposure control, adjustment of the mA and/or kV according to patient size and/or use of  iterative reconstruction technique. CONTRAST:  160mL OMNIPAQUE IOHEXOL 350 MG/ML SOLN COMPARISON:  None. FINDINGS: CTA CHEST FINDINGS Cardiovascular: Normal cardiac size. Trace pericardial fluid. Normal size main and branch pulmonary arteries. No evidence of pulmonary embolism. Motion artifact in the ascending aorta. There is moderate calcified atherosclerosis of the aortic arch. 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 This is not in association with an aberrant subclavian artery. No evidence of thoracic aortic aneurysm, dissection, or intramural hematoma. There are multiple prominent venous collateral vessels in the chest primarily on the right, suggestive of possible right brachiocephalic/subclavian vein narrowing, possibly as it passes under the clavicle. Mediastinum/Nodes: There are multiple enlarged mediastinal and hilar lymph nodes. For reference, a prevascular lymph node measures 1.3 cm (series 7, image 45). Left paratracheal lymph node measures 1.1 cm (series 7, image 48). Lungs/Pleura: Moderate right and small left pleural effusions with adjacent atelectasis. Diffuse bronchial  wall thickening, severe in the lower lungs. Diffuse interlobular septal thickening and ground-glass. Moderate centrilobular and paraseptal emphysema. Biapical pleuroparenchymal scarring. No visible pulmonary nodule. Musculoskeletal: No acute osseous abnormality. No suspicious lytic or blastic lesion. There is an age-indeterminate anterior compression deformity of T8 with a proximally 20% height loss. Review of the MIP images confirms the above findings. CTA ABDOMEN AND PELVIS FINDINGS VASCULAR Aorta: There is an infrarenal abdominal aortic aneurysm with lobular mural thrombus. This measures at maximum 3.8 x 3.7 cm (series 7, image 141). The mural thrombus results in mild-to-moderate focal stenosis. There is hyperdense thickening of the aortic wall (series 7, image 140). There is possible mild adjacent stranding. Celiac: Patent without stenosis. SMA: Patent without significant stenosis. Renals: Patent without significant stenosis. There are 2 small left renal arteries. IMA: Patent with mild-to-moderate stenosis at the IMA ostia which originates at the level of the aortic aneurysm. Inflow: Patent without significant stenosis. Veins: No obvious venous abnormality within the limitations of this arterial phase study. Review of the MIP images confirms the above findings. NON-VASCULAR Hepatobiliary: No focal liver abnormality is seen. No gallstones, gallbladder wall thickening, or biliary dilatation. Pancreas: Unremarkable. No pancreatic ductal dilatation or surrounding inflammatory changes. Spleen: Normal in size without focal abnormality. Adrenals/Urinary Tract: There is bilateral adrenal gland thickening, left worse than right. Probable 1.4 cm left adrenal adenoma (coronal image 90). No hydronephrosis or nephrolithiasis. The bladder is well distended but otherwise unremarkable. Stomach/Bowel: The stomach is within normal limits. There is no evidence of bowel obstruction.The appendix is normal. There is nonspecific  mesenteric haziness. Lymphatic: There are multiple enlarged retroperitoneal lymph nodes. For reference: Left periaortic lymph nodes measures 1.2 cm and 1.0 cm (series 7, images 131 and 132). Reproductive: Mildly enlarged prostate. Other: There is a small fat containing left inguinal hernia. Musculoskeletal: No acute or significant osseous findings. Review of the MIP images confirms the above findings. IMPRESSION: Infrarenal abdominal aortic aneurysm measuring up to 3.8 x 3.7 cm, with significant mural thrombus. There is hyperdense aortic wall thickening, concerning for a high attenuation crescent sign, which is a sign of impending AAA rupture, although rupture would be atypical at this size of aneurysm. There is adjacent mild periaortic stranding and retroperitoneal adenopathy, suggesting this could alternatively represent aortitis. Recommend vascular surgery consultation and correlation with inflammatory markers. Correlation with prior imaging, if available, would be useful. Moderate pulmonary edema with moderate right and small left pleural effusions and adjacent basilar atelectasis. Prominent mediastinal and hilar lymph nodes, which are  favored to be reactive. Attention on follow-up exam. Age-indeterminate anterior compression deformity of T8 with approximately 20% height loss. These results were called by telephone at the time of interpretation on 12/23/2021 at 12:54 pm to provider [facility] , who verbally acknowledged these results. Electronically Signed   By: Maurine Simmering M.D.   On: 12/23/2021 12:58    Microbiology: Recent Results (from the past 240 hour(s))  Culture, blood (Routine x 2)     Status: None (Preliminary result)   Collection Time: 12/16/2021 12:10 AM   Specimen: BLOOD  Result Value Ref Range Status   Specimen Description BLOOD SITE NOT SPECIFIED  Final   Special Requests   Final    BOTTLES DRAWN AEROBIC AND ANAEROBIC Blood Culture adequate volume   Culture   Final    NO GROWTH 2  DAYS Performed at [facility], [address], [place], [place] [address]    Report Status PENDING  Incomplete  Culture, blood (Routine x 2)     Status: None (Preliminary result)   Collection Time: 12/27/2021 12:15 AM   Specimen: BLOOD  Result Value Ref Range Status   Specimen Description BLOOD SITE NOT SPECIFIED  Final   Special Requests   Final    BOTTLES DRAWN AEROBIC AND ANAEROBIC Blood Culture adequate volume   Culture   Final    NO GROWTH 2 DAYS Performed at [facility], [address], [place], [place] [address]    Report Status PENDING  Incomplete  Resp Panel by RT-PCR (Flu A&B, Covid)     Status: None   Collection Time: 12/20/2021 12:20 AM   Specimen: Nasopharyngeal(NP) swabs in vial transport medium  Result Value Ref Range Status   SARS Coronavirus 2 by RT PCR NEGATIVE NEGATIVE Final    Comment: (NOTE) SARS-CoV-2 target nucleic acids are NOT DETECTED.  The SARS-CoV-2 RNA is generally detectable in upper respiratory specimens during the acute phase of infection. The lowest concentration of SARS-CoV-2 viral copies this assay can detect is 138 copies/mL. A negative result does not preclude SARS-Cov-2 infection and should not be used as the sole basis for treatment or other patient management decisions. A negative result may occur with  improper specimen collection/handling, submission of specimen other than nasopharyngeal swab, presence of viral mutation(s) within the areas targeted by this assay, and inadequate number of viral copies(<138 copies/mL). A negative result must be combined with clinical observations, patient history, and epidemiological information. The expected result is Negative.  Fact Sheet for Patients:  EntrepreneurPulse.com.au  Fact Sheet for Healthcare Providers:  IncredibleEmployment.be  This test is no t yet approved or cleared by the [place] FDA and  has been authorized for detection and/or  diagnosis of SARS-CoV-2 by FDA under an Emergency Use Authorization (EUA). This EUA will remain  in effect (meaning this test can be used) for the duration of the COVID-19 declaration under Section 564(b)(1) of the Act, 21 U.S.C.section 360bbb-3(b)(1), unless the authorization is terminated  or revoked sooner.       Influenza A by PCR NEGATIVE NEGATIVE Final   Influenza B by PCR NEGATIVE NEGATIVE Final    Comment: (NOTE) The Xpert Xpress SARS-CoV-2/FLU/RSV plus assay is intended as an aid in the diagnosis of influenza from Nasopharyngeal swab specimens and should not be used as a sole basis for treatment. Nasal washings and aspirates are unacceptable for Xpert Xpress SARS-CoV-2/FLU/RSV testing.  Fact Sheet for Patients: EntrepreneurPulse.com.au  Fact Sheet for Healthcare Providers: [email or web address]  This test  is not yet approved or cleared by the [place] and has been authorized for detection and/or diagnosis of SARS-CoV-2 by FDA under an Emergency Use Authorization (EUA). This EUA will remain in effect (meaning this test can be used) for the duration of the COVID-19 declaration under Section 564(b)(1) of the Act, 21 U.S.C. section 360bbb-3(b)(1), unless the authorization is terminated or revoked.  Performed at [facility], [place] [address] [place], [place] [address]      Labs: Basic Metabolic Panel: Recent Labs  Lab 01/01/2022 0010 01/11/2022 0408  NA 137 140  K 3.9 4.6  CL 106 111  CO2 17* 11*  GLUCOSE 167* 158*  BUN 36* 37*  CREATININE 2.21* 2.04*  CALCIUM 8.5* 8.5*   Liver Function Tests: Recent Labs  Lab 12/25/2021 0010  AST 41  ALT 35  ALKPHOS 82  BILITOT 0.5  PROT 6.5  ALBUMIN 2.2*   No results for input(s): LIPASE, AMYLASE in the last 168 hours. No results for input(s): AMMONIA in the last 168 hours. CBC: Recent Labs  Lab 12/18/2021 0010 01/05/2022 0408  WBC 28.8* 28.5*  NEUTROABS 26.5*  --    HGB 9.8* 9.4*  HCT 31.0* 31.4*  MCV 87.3 93.5  PLT 130* 107*   Cardiac Enzymes: No results for input(s): CKTOTAL, CKMB, CKMBINDEX, TROPONINI in the last 168 hours. BNP: BNP (last 3 results) Recent Labs    01/01/22 0207  BNP 411.0*    ProBNP (last 3 results) No results for input(s): PROBNP in the last 8760 hours.  CBG: Recent Labs  Lab 01/04/2022 0837 01/08/2022 1253 12/25/2021 1621 01/06/2022 2055 01/08/22 0809  GLUCAP 161* 164* 132* 128* 105*       Signed:  Cailean Heacock A MD.  [facility] 02/08/2022, 1:52 PM

## 2022-01-12 DEATH — deceased

## 2022-01-26 ENCOUNTER — Ambulatory Visit: Payer: Medicare Other | Admitting: Nurse Practitioner

## 2023-01-15 IMAGING — CR DG CHEST 2V
2 series · 2 of 2 positions shown · non-contrast
Comparison: Radiograph 12/25/2021, CT 12/23/2021

CLINICAL DATA: Suspected sepsis.

EXAM:
CHEST - 2 VIEW

[chest lat]
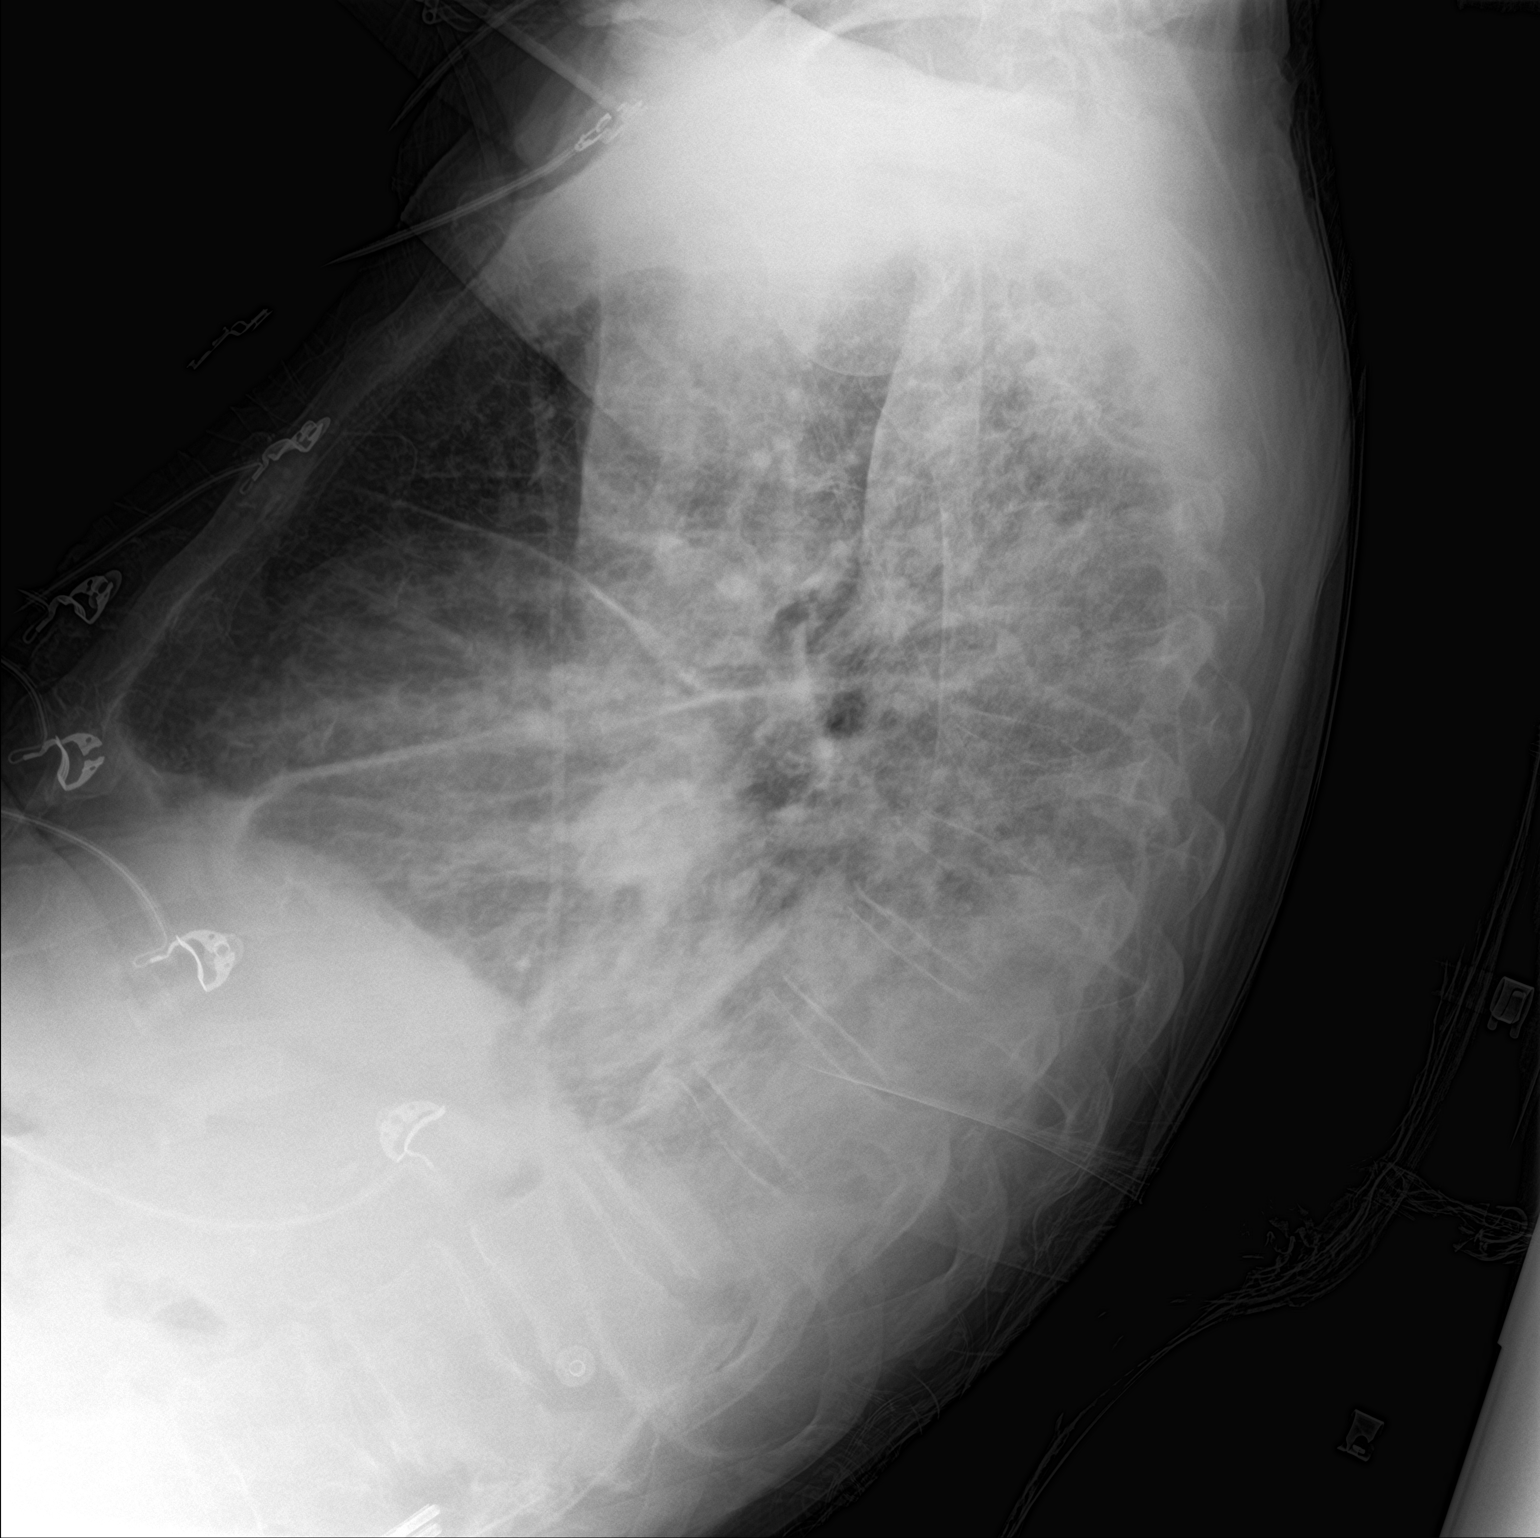

[chest ap]
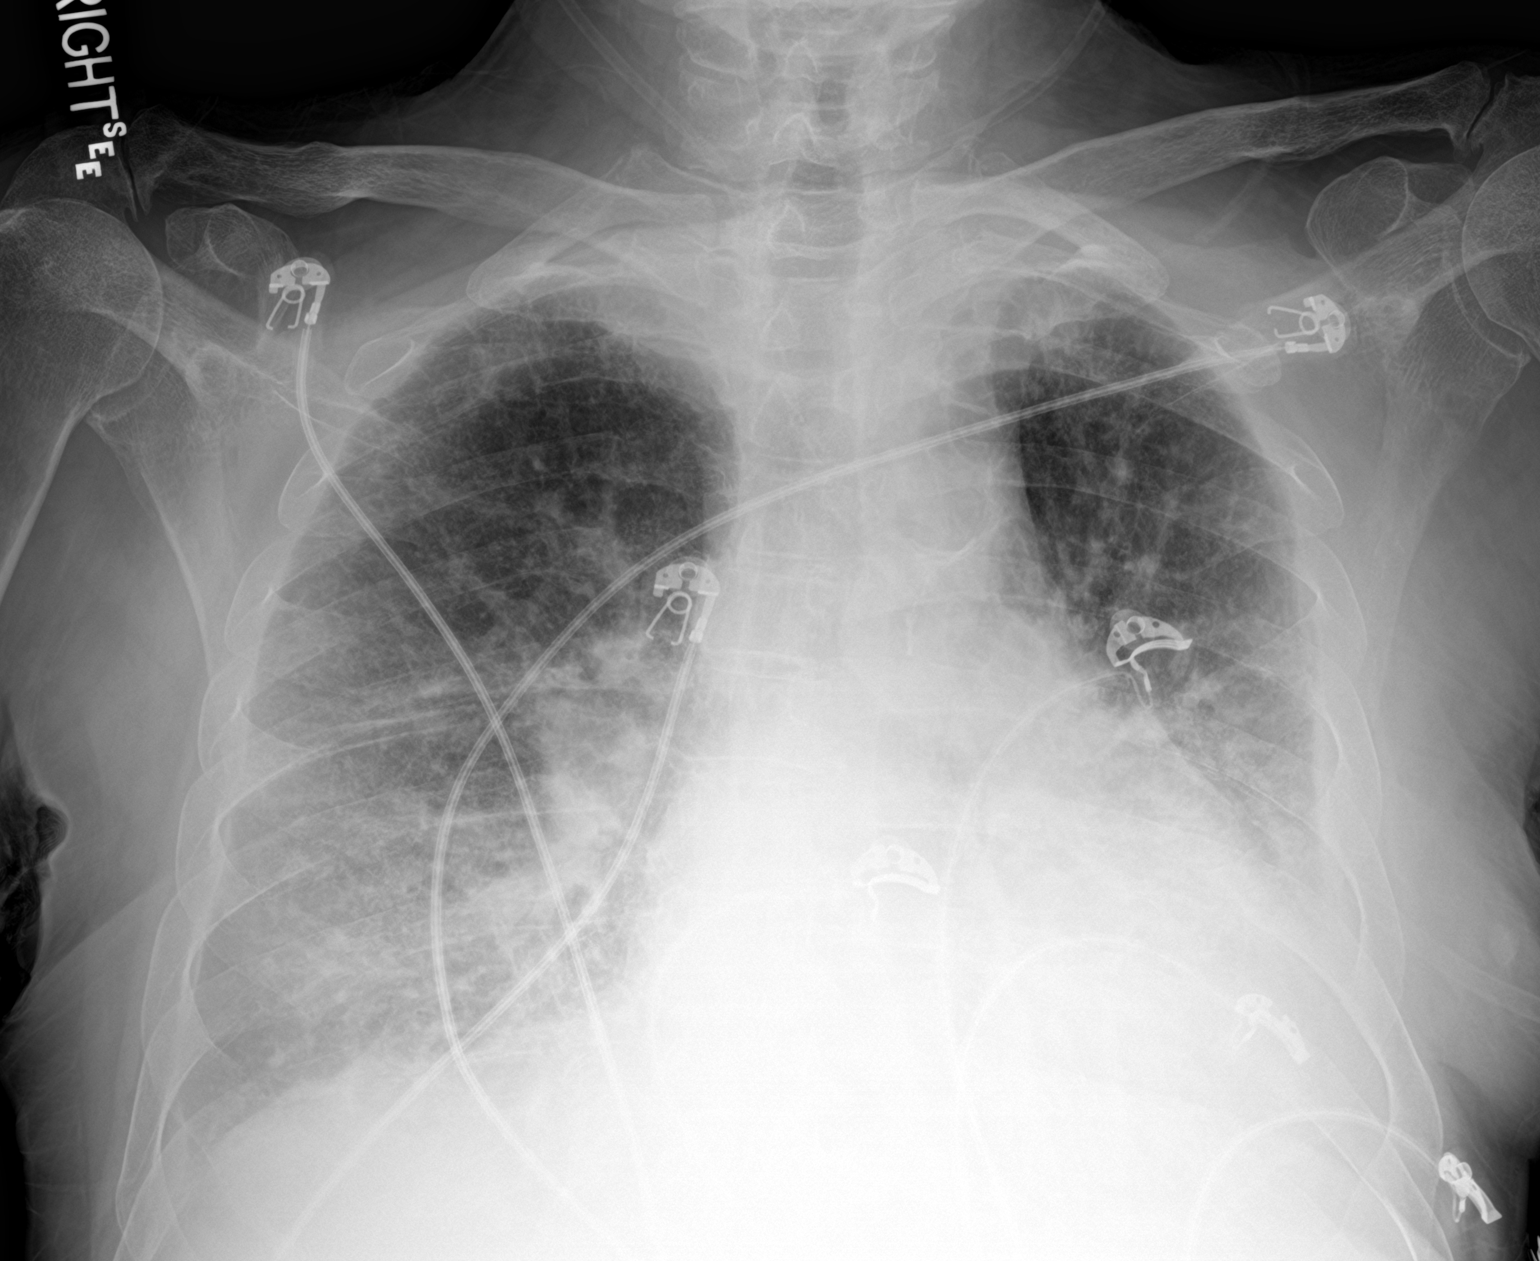

[2 of 2 positions shown; findings below may reference images not displayed]

FINDINGS: Cardiomegaly. Moderate interstitial thickening likely pulmonary
edema, similar. Persistent but improved bilateral pleural effusions.
Trace fluid in the fissures. No pneumothorax. Emphysematous changes
better appreciated on prior CT.
IMPRESSION: 1. Cardiomegaly with pulmonary edema.
2. Improved but persistent bilateral pleural effusions.
